# Patient Record
Sex: Female | Born: 1995 | Race: Black or African American | Hispanic: No | Marital: Single | Smoking: Never smoker
Health system: Southern US, Community
[De-identification: ages and names within clinical notes are randomized; demographics above are authoritative.]

## PROBLEM LIST (undated history)

## (undated) ENCOUNTER — Inpatient Hospital Stay (HOSPITAL_COMMUNITY): Admission: RE | Payer: Medicaid Other | Source: Ambulatory Visit | Admitting: Obstetrics and Gynecology

## (undated) DIAGNOSIS — A749 Chlamydial infection, unspecified: Secondary | ICD-10-CM

## (undated) DIAGNOSIS — Z9289 Personal history of other medical treatment: Secondary | ICD-10-CM

## (undated) DIAGNOSIS — T7840XA Allergy, unspecified, initial encounter: Secondary | ICD-10-CM

## (undated) DIAGNOSIS — A599 Trichomoniasis, unspecified: Secondary | ICD-10-CM

## (undated) HISTORY — DX: Trichomoniasis, unspecified: A59.9

## (undated) HISTORY — DX: Chlamydial infection, unspecified: A74.9

## (undated) HISTORY — DX: Personal history of other medical treatment: Z92.89

## (undated) HISTORY — PX: HERNIA REPAIR: SHX51

## (undated) HISTORY — DX: Allergy, unspecified, initial encounter: T78.40XA

---
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
## 2015-02-08 ENCOUNTER — Encounter: Payer: Medicaid Other | Admitting: Family Medicine

## 2015-04-09 ENCOUNTER — Telehealth: Payer: Self-pay | Admitting: Family Medicine

## 2015-04-09 NOTE — Telephone Encounter (Signed)
MD to be made aware.  

## 2015-04-09 NOTE — Telephone Encounter (Signed)
FYI:  Patient called and requested a refill on her birth control she told me that she has been off of the pills for a month or two. She states that she thinks she had a miscarriage last week since she had a lot of blood clots. I advised patient that she would need to come in for an appointment. I tried to schedule her an appointment for next week but she isn't sure she will be off work. She will call back on Monday to schedule.

## 2015-04-12 NOTE — Telephone Encounter (Signed)
Noted pt needs OV first,use condoms in meantime

## 2015-04-12 NOTE — Telephone Encounter (Signed)
Call placed to patient and patient made aware.   Will call to schedule at later point.

## 2015-05-11 ENCOUNTER — Emergency Department
Admission: EM | Admit: 2015-05-11 | Discharge: 2015-05-12 | Disposition: A | Discharge status: Home / Self Care | Payer: Medicaid Other | Attending: Emergency Medicine | Admitting: Emergency Medicine

## 2015-05-11 ENCOUNTER — Encounter: Payer: Self-pay | Admitting: *Deleted

## 2015-05-11 DIAGNOSIS — N760 Acute vaginitis: Secondary | ICD-10-CM | POA: Insufficient documentation

## 2015-05-11 DIAGNOSIS — B9689 Other specified bacterial agents as the cause of diseases classified elsewhere: Secondary | ICD-10-CM

## 2015-05-11 DIAGNOSIS — Z79899 Other long term (current) drug therapy: Secondary | ICD-10-CM | POA: Insufficient documentation

## 2015-05-11 DIAGNOSIS — Z3202 Encounter for pregnancy test, result negative: Secondary | ICD-10-CM | POA: Insufficient documentation

## 2015-05-11 LAB — POCT PREGNANCY, URINE: Preg Test, Ur: NEGATIVE

## 2015-05-11 LAB — URINALYSIS COMPLETE WITH MICROSCOPIC (ARMC ONLY)
BILIRUBIN URINE: NEGATIVE
GLUCOSE, UA: NEGATIVE mg/dL
Ketones, ur: NEGATIVE mg/dL
Nitrite: NEGATIVE
Protein, ur: NEGATIVE mg/dL
Specific Gravity, Urine: 1.024 (ref 1.005–1.030)
pH: 6 (ref 5.0–8.0)

## 2015-05-11 NOTE — ED Notes (Signed)
Pt has vaginal itching with vag discharge since yesterday.  No vag bleeding.  No urinary sx.

## 2015-05-12 LAB — WET PREP, GENITAL
TRICH WET PREP: NONE SEEN
YEAST WET PREP: NONE SEEN

## 2015-05-12 LAB — CHLAMYDIA/NGC RT PCR (ARMC ONLY)
Chlamydia Tr: NOT DETECTED
N gonorrhoeae: NOT DETECTED

## 2015-05-12 MED ORDER — FLUCONAZOLE 50 MG PO TABS
ORAL_TABLET | ORAL | Status: AC
  Filled 2015-05-12: qty 1

## 2015-05-12 MED ORDER — FLUCONAZOLE 100 MG PO TABS
150.0000 mg | ORAL_TABLET | Freq: Every day | ORAL | Status: DC
  Administered 2015-05-12: 150 mg via ORAL

## 2015-05-12 MED ORDER — METRONIDAZOLE 500 MG PO TABS
500.0000 mg | ORAL_TABLET | Freq: Two times a day (BID) | ORAL | Status: AC
Start: 2015-05-12 — End: 2015-05-18

## 2015-05-12 MED ORDER — FLUCONAZOLE 100 MG PO TABS
ORAL_TABLET | ORAL | Status: AC
Start: 2015-05-12 — End: 2015-05-12
  Filled 2015-05-12: qty 1

## 2015-05-12 MED ORDER — METRONIDAZOLE 500 MG PO TABS
500.0000 mg | ORAL_TABLET | Freq: Once | ORAL | Status: AC
  Administered 2015-05-12: 500 mg via ORAL
  Filled 2015-05-12: qty 1

## 2015-05-12 NOTE — ED Notes (Signed)
Assisted Dr. Manson PasseyBrown with pelvic exam. Specimens obtained, labeled appropriately and sent to lab. Pt tolerated well.

## 2015-05-12 NOTE — ED Provider Notes (Signed)
[facility] Emergency Department Provider Note  ____________________________________________  Time seen: 11:20 PM  I have reviewed the triage vital signs and the nursing notes.   HISTORY  Chief Complaint Vaginal Itching     HPI Sandra Palmer is a 19 y.o. female presents with vaginal itching and discharge times one day. Patient admits to unprotected sex approximately 5 days ago.    Past Medical History  Diagnosis Date  . Allergy     Patient Active Problem List   Diagnosis Date Noted  . Irregular intermenstrual bleeding 05/15/2014  . Physical exam 05/15/2014  . Allergic rhinitis 11/10/2013  . Bacterial respiratory infection 11/10/2013  . Contraceptive management 04/28/2013    Past Surgical History  Procedure Laterality Date  . Hernia repair      Current Outpatient Rx  Name  Route  Sig  Dispense  Refill  . clotrimazole (LOTRIMIN) 1 % cream   Topical   Apply 1 application topically 2 (two) times daily.   30 g   0   . ibuprofen (ADVIL,MOTRIN) 600 MG tablet   Oral   Take 1 tablet (600 mg total) by mouth every 6 (six) hours as needed.   30 tablet   0   . Norgestimate-Ethinyl Estradiol Triphasic 0.18/0.215/0.25 MG-25 MCG tab   Oral   Take 1 tablet by mouth daily.   1 Package   3     Allergies Azithromycin  No family history on file.  Social History Social History  Substance Use Topics  . Smoking status: Never Smoker   . Smokeless tobacco: None  . Alcohol Use: No     Comment: social    Review of Systems  Constitutional: Negative for fever. Eyes: Negative for visual changes. ENT: Negative for sore throat. Cardiovascular: Negative for chest pain. Respiratory: Negative for shortness of breath. Gastrointestinal: Negative for abdominal pain, vomiting and diarrhea. Genitourinary: Negative for dysuria. Positive vaginal discharge and itching Musculoskeletal: Negative for back pain. Skin: Negative for rash. Neurological:  Negative for headaches, focal weakness or numbness.   10-point ROS otherwise negative.  ____________________________________________   PHYSICAL EXAM:  VITAL SIGNS: ED Triage Vitals  Enc Vitals Group     BP 05/11/15 2224 139/78 mmHg     Pulse Rate 05/11/15 2224 82     Resp 05/11/15 2224 18     Temp 05/11/15 2224 98.5 F (36.9 C)     Temp Source 05/11/15 2224 Oral     SpO2 05/11/15 2224 99 %     Weight 05/11/15 2224 150 lb (68.04 kg)     Height 05/11/15 2224  (1.702 m)     Head Cir --      Peak Flow --      Pain Score 05/11/15 2226 8     Pain Loc --      Pain Edu? --      Excl. in GC? --      Constitutional: Alert and oriented. Well appearing and in no distress. Eyes: Conjunctivae are normal. PERRL. Normal extraocular movements. ENT   Head: Normocephalic and atraumatic.   Nose: No congestion/rhinnorhea.   Mouth/Throat: Mucous membranes are moist.   Neck: No stridor. Hematological/Lymphatic/Immunilogical: No cervical lymphadenopathy. Cardiovascular: Normal rate, regular rhythm. Normal and symmetric distal pulses are present in all extremities. No murmurs, rubs, or gallops. Respiratory: Normal respiratory effort without tachypnea nor retractions. Breath sounds are clear and equal bilaterally. No wheezes/rales/rhonchi. Gastrointestinal: Soft and nontender. No distention. There is no CVA tenderness. Genitourinary: Yellow to  white vaginal discharge noted on exam Musculoskeletal: Nontender with normal range of motion in all extremities. No joint effusions.  No lower extremity tenderness nor edema. Neurologic:  Normal speech and language. No gross focal neurologic deficits are appreciated. Speech is normal.  Skin:  Skin is warm, dry and intact. No rash noted. Psychiatric: Mood and affect are normal. Speech and behavior are normal. Patient exhibits appropriate insight and judgment.  ____________________________________________    LABS (pertinent  positives/negatives) Labs Reviewed  WET PREP, GENITAL - Abnormal; Notable for the following:    Clue Cells Wet Prep HPF POC MODERATE (*)    WBC, Wet Prep HPF POC TOO NUMEROUS TO COUNT (*)    All other components within normal limits  URINALYSIS COMPLETEWITH MICROSCOPIC ([facility] ONLY) - Abnormal; Notable for the following:    Color, Urine YELLOW (*)    APPearance HAZY (*)    Hgb urine dipstick 1+ (*)    Leukocytes, UA 3+ (*)    Bacteria, UA RARE (*)    Squamous Epithelial / LPF 0-5 (*)    All other components within normal limits  CHLAMYDIA/NGC RT PCR ([facility] ONLY)  POCT PREGNANCY, URINE       INITIAL IMPRESSION / ASSESSMENT AND PLAN / ED COURSE  Pertinent labs & imaging results that were available during my care of the patient were reviewed by me and considered in my medical decision making (see chart for details).  Patient received Flagyl in the emergency department will be prescribed the same for home  ____________________________________________   FINAL CLINICAL IMPRESSION(S) / ED DIAGNOSES  Final diagnoses:  BV (bacterial vaginosis)      Darci Currentandolph N Brown, MD 05/12/15 [phone]

## 2015-05-12 NOTE — Discharge Instructions (Signed)

## 2015-05-18 DIAGNOSIS — Z9289 Personal history of other medical treatment: Secondary | ICD-10-CM

## 2015-05-18 DIAGNOSIS — A749 Chlamydial infection, unspecified: Secondary | ICD-10-CM

## 2015-05-18 HISTORY — DX: Personal history of other medical treatment: Z92.89

## 2015-05-18 HISTORY — DX: Chlamydial infection, unspecified: A74.9

## 2015-09-18 ENCOUNTER — Emergency Department
Admission: EM | Admit: 2015-09-18 | Discharge: 2015-09-18 | Disposition: A | Discharge status: Home / Self Care | Payer: Medicaid Other | Attending: Emergency Medicine | Admitting: Emergency Medicine

## 2015-09-18 ENCOUNTER — Encounter: Payer: Self-pay | Admitting: Emergency Medicine

## 2015-09-18 DIAGNOSIS — Y92410 Unspecified street and highway as the place of occurrence of the external cause: Secondary | ICD-10-CM | POA: Insufficient documentation

## 2015-09-18 DIAGNOSIS — S3992XA Unspecified injury of lower back, initial encounter: Secondary | ICD-10-CM | POA: Insufficient documentation

## 2015-09-18 DIAGNOSIS — S0990XA Unspecified injury of head, initial encounter: Secondary | ICD-10-CM | POA: Insufficient documentation

## 2015-09-18 DIAGNOSIS — Y9389 Activity, other specified: Secondary | ICD-10-CM | POA: Insufficient documentation

## 2015-09-18 DIAGNOSIS — Y998 Other external cause status: Secondary | ICD-10-CM | POA: Insufficient documentation
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
## 2015-09-19 ENCOUNTER — Encounter: Payer: Self-pay | Admitting: Emergency Medicine

## 2015-09-19 ENCOUNTER — Emergency Department
Admission: EM | Admit: 2015-09-19 | Discharge: 2015-09-19 | Disposition: A | Discharge status: Home / Self Care | Payer: Self-pay | Attending: Student | Admitting: Student

## 2015-09-19 ENCOUNTER — Emergency Department: Payer: Medicaid Other

## 2015-09-19 DIAGNOSIS — S46912A Strain of unspecified muscle, fascia and tendon at shoulder and upper arm level, left arm, initial encounter: Secondary | ICD-10-CM | POA: Insufficient documentation

## 2015-09-19 DIAGNOSIS — Z79899 Other long term (current) drug therapy: Secondary | ICD-10-CM | POA: Insufficient documentation

## 2015-09-19 DIAGNOSIS — S39012A Strain of muscle, fascia and tendon of lower back, initial encounter: Secondary | ICD-10-CM | POA: Insufficient documentation

## 2015-09-19 DIAGNOSIS — Y9389 Activity, other specified: Secondary | ICD-10-CM | POA: Insufficient documentation

## 2015-09-19 DIAGNOSIS — Y998 Other external cause status: Secondary | ICD-10-CM | POA: Insufficient documentation

## 2015-09-19 DIAGNOSIS — Y92410 Unspecified street and highway as the place of occurrence of the external cause: Secondary | ICD-10-CM | POA: Insufficient documentation

## 2015-09-19 MED ORDER — CYCLOBENZAPRINE HCL 5 MG PO TABS: 5.0000 mg | ORAL_TABLET | Freq: Three times a day (TID) | ORAL | Status: DC | PRN

## 2015-09-19 MED ORDER — IBUPROFEN 800 MG PO TABS: 800.0000 mg | ORAL_TABLET | Freq: Three times a day (TID) | ORAL | Status: DC | PRN
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
## 2015-09-19 NOTE — ED Notes (Addendum)
Pt ambulatory to XR. No acute distress noted.

## 2016-12-20 ENCOUNTER — Telehealth: Payer: Self-pay

## 2016-12-20 NOTE — Telephone Encounter (Signed)
Pt calling - she has not had a period in 5972m, 2 neg home preg tests.  Does she need blood work?  Before calling pt I noticed she has an appt c J[facility] on the 28th.  Pt states she has had unprotected sex for a long time - she was on depo - last inj 06/2016.  On May 25th she had some bleeding but nothing since.  Pt wants to be seen sooner.  Adv that is a lengthy appt so that is the earliest we have.  Since she is not bleeding not there is no need for us to see her today.  Pt aware to call if bleeds again.  Pt states she needs everything done as she hasn't been here in a while.  Adv everything would be done on the 28th.

## 2016-12-26 NOTE — Telephone Encounter (Signed)
Pt is calling wanting to speak to the nurse she spoke with. Please call

## 2016-12-26 NOTE — Telephone Encounter (Signed)
LMTC

## 2016-12-28 NOTE — Telephone Encounter (Signed)
Pt hasn't returned call. 

## 2017-01-11 ENCOUNTER — Ambulatory Visit (INDEPENDENT_AMBULATORY_CARE_PROVIDER_SITE_OTHER): Payer: Medicaid Other | Admitting: Advanced Practice Midwife

## 2017-01-11 ENCOUNTER — Encounter: Payer: Self-pay | Admitting: Advanced Practice Midwife

## 2017-01-11 VITALS — BP 114/70 | Ht 67.0 in | Wt 141.0 lb

## 2017-01-11 DIAGNOSIS — Z124 Encounter for screening for malignant neoplasm of cervix: Secondary | ICD-10-CM

## 2017-01-11 DIAGNOSIS — Z113 Encounter for screening for infections with a predominantly sexual mode of transmission: Secondary | ICD-10-CM

## 2017-01-11 DIAGNOSIS — Z01419 Encounter for gynecological examination (general) (routine) without abnormal findings: Secondary | ICD-10-CM

## 2017-01-11 DIAGNOSIS — Z308 Encounter for other contraceptive management: Secondary | ICD-10-CM | POA: Diagnosis not present

## 2017-01-11 DIAGNOSIS — Z30011 Encounter for initial prescription of contraceptive pills: Secondary | ICD-10-CM

## 2017-01-11 DIAGNOSIS — Z0000 Encounter for general adult medical examination without abnormal findings: Secondary | ICD-10-CM | POA: Diagnosis not present

## 2017-01-11 MED ORDER — NORETHIN ACE-ETH ESTRAD-FE 1-20 MG-MCG PO TABS: 1.0000 | ORAL_TABLET | Freq: Every day | ORAL | 11 refills | Status: DC

## 2017-01-11 NOTE — Progress Notes (Signed)
Patient ID: Sandra Palmer, female   DOB: 1995-09-23, 21 y.o.   MRN: [id number]     Gynecology Annual Exam  PCP: System, Pcp Not In  Chief Complaint:  Chief Complaint  Patient presents with  . Annual Exam    History of Present Illness: Patient is a 21 y.o. G0P0000 presents for annual exam. The patient has complaints today of irregular periods since her last Depo shot in December. She has not been on birth control since then. She admits unprotected intercourse in the last few months. She had some light bleeding a few weeks ago that lasted for a couple of days.    LMP: No LMP recorded. Patient is not currently having periods (Reason: Irregular Periods). Menarche:not applicable Average Interval: irregular, see HPI Duration of flow: 3-4 days Heavy Menses: no Clots: no Intermenstrual Bleeding: no Postcoital Bleeding: no Dysmenorrhea: no  The patient is sexually active. She currently uses none for contraception. She denies dyspareunia.  The patient does occasionally perform self breast exams.  There is no notable family history of breast or ovarian cancer in her family.  The patient wears seatbelts: yes.  The patient has regular exercise: no.  She is active at work but denies cardiac exercise. She admits to some healthy foods but usually eats fast food. She admits good support from friends and family.  The patient denies current symptoms of depression.    Review of Systems: Review of Systems  Constitutional: Negative.   HENT: Negative.   Eyes: Negative.   Respiratory: Negative.   Cardiovascular: Negative.   Gastrointestinal: Negative.   Genitourinary: Negative.   Musculoskeletal: Negative.   Skin: Negative.   Neurological: Negative.   Endo/Heme/Allergies: Negative.   Psychiatric/Behavioral: Negative.     Past Medical History:  Past Medical History:  Diagnosis Date  . Allergy   . Chlamydia 05/18/2015  . History of Papanicolaou smear of cervix 05/18/2015   Nil/Pos     Past Surgical History:  Past Surgical History:  Procedure Laterality Date  . HERNIA REPAIR      Gynecologic History:  No LMP recorded. Patient is not currently having periods (Reason: Irregular Periods). Contraception: none Last Pap: Results were: has not yet had a PAP   Obstetric History: G0P0000  Family History:  No family history on file.  Social History:  Social History   Social History  . Marital status: Single    Spouse name: N/A  . Number of children: N/A  . Years of education: N/A   Occupational History  . Not on file.   Social History Main Topics  . Smoking status: Never Smoker  . Smokeless tobacco: Not on file  . Alcohol use No     Comment: social  . Drug use: No  . Sexual activity: Yes    Birth control/ protection: Condom   Other Topics Concern  . Not on file   Social History Narrative   Lives with mother    Allergies:  Allergies  Allergen Reactions  . Azithromycin Swelling and Rash    Medications: Prior to Admission medications   Medication Sig Start Date End Date Taking? Authorizing Provider  cyclobenzaprine (FLEXERIL) 5 MG tablet Take 1 tablet (5 mg total) by mouth every 8 (eight) hours as needed for muscle spasms. Patient not taking: Reported on 01/11/2017 09/19/15   Evangeline Dakin, PA-C  ibuprofen (ADVIL,MOTRIN) 800 MG tablet Take 1 tablet (800 mg total) by mouth every 8 (eight) hours as needed. Patient not taking: Reported on 01/11/2017  09/19/15   Beers, Charmayne Sheerharles M, PA-C  Norgestimate-Ethinyl Estradiol Triphasic 0.18/0.215/0.25 MG-25 MCG tab Take 1 tablet by mouth daily. Patient not taking: Reported on 01/11/2017 09/23/14   Salley Scarleturham, Kawanta F, MD    Physical Exam Vitals: Blood pressure 114/70, height 5\' 7"  (1.702 m), weight 141 lb (64 kg).  General: NAD HEENT: normocephalic, anicteric Thyroid: no enlargement, no palpable nodules Pulmonary: No increased work of breathing, CTAB Cardiovascular: RRR, distal pulses 2+ Breast: Breast  symmetrical, no tenderness, no palpable nodules or masses, no skin or nipple retraction present, no nipple discharge.  No axillary or supraclavicular lymphadenopathy. Abdomen: NABS, soft, non-tender, non-distended.  Umbilicus without lesions.  No hepatomegaly, splenomegaly or masses palpable. No evidence of hernia  Genitourinary:  External: Normal external female genitalia.  Normal urethral meatus, normal  Bartholin's and Skene's glands.    Vagina: Normal vaginal mucosa, no evidence of prolapse.    Cervix: Grossly normal in appearance, no bleeding, no CMT  Uterus: Non-enlarged, mobile, normal contour.    Adnexa: ovaries non-enlarged, no adnexal masses  Rectal: deferred  Lymphatic: no evidence of inguinal lymphadenopathy Extremities: no edema, erythema, or tenderness Neurologic: Grossly intact Psychiatric: mood appropriate, affect full   Assessment: 21 y.o. G0P0000 Well woman exam with PAPtima  Plan: Problem List Items Addressed This Visit    None    Visit Diagnoses    Well woman exam with routine gynecological exam    -  Primary      1) 4) Gardasil Series discussed and if applicable offered to patient - Patient has not previously completed 3 shot series   2) STI screening was offered and accepted   3) ASCCP guidelines and rational discussed.  Patient opts for yearly screening interval  4) Contraception - Education given regarding options for contraception, including pills, depo, ring, patch, LARCs. Patient chooses pills at this time.  5) Increase healthy lifestyle diet and exercise   6) Follow up 1 year for routine annual exam   Tresea MallJane Raylon Lamson, CNM

## 2017-01-14 LAB — IGP,CTNGTV,RFX APTIMA HPV ASCU
Chlamydia, Nuc. Acid Amp: NEGATIVE
Gonococcus, Nuc. Acid Amp: NEGATIVE
PAP SMEAR COMMENT: 0
Trich vag by NAA: NEGATIVE

## 2017-05-08 ENCOUNTER — Ambulatory Visit (INDEPENDENT_AMBULATORY_CARE_PROVIDER_SITE_OTHER): Payer: Medicaid Other | Admitting: Obstetrics & Gynecology

## 2017-05-08 ENCOUNTER — Other Ambulatory Visit (HOSPITAL_COMMUNITY)
Admission: RE | Admit: 2017-05-08 | Discharge: 2017-05-08 | Disposition: A | Discharge status: Home / Self Care | Payer: Medicaid Other | Source: Ambulatory Visit | Attending: Obstetrics & Gynecology | Admitting: Obstetrics & Gynecology

## 2017-05-08 ENCOUNTER — Encounter: Payer: Self-pay | Admitting: Obstetrics & Gynecology

## 2017-05-08 VITALS — BP 125/84 | HR 75 | Ht 67.0 in | Wt 147.4 lb

## 2017-05-08 DIAGNOSIS — Z309 Encounter for contraceptive management, unspecified: Secondary | ICD-10-CM | POA: Diagnosis not present

## 2017-05-08 DIAGNOSIS — N760 Acute vaginitis: Secondary | ICD-10-CM

## 2017-05-08 DIAGNOSIS — N921 Excessive and frequent menstruation with irregular cycle: Secondary | ICD-10-CM | POA: Insufficient documentation

## 2017-05-08 DIAGNOSIS — B9689 Other specified bacterial agents as the cause of diseases classified elsewhere: Secondary | ICD-10-CM

## 2017-05-08 DIAGNOSIS — Z30011 Encounter for initial prescription of contraceptive pills: Secondary | ICD-10-CM

## 2017-05-08 MED ORDER — TAYTULLA 1-20 MG-MCG(24) PO CAPS: 1.0000 | ORAL_CAPSULE | Freq: Every day | ORAL | 3 refills | Status: DC

## 2017-05-08 NOTE — Progress Notes (Signed)
GYNECOLOGY OFFICE VISIT NOTE  History:  21 y.o. G0 here today for discussion of AUB management. Has been on OCPs and Depo in the past, not been taking it recently. Still has AUB, for past several years, does not remember what workup was done. She denies any current abnormal vaginal discharge, pelvic pain or other concerns.   Past Medical History:  Diagnosis Date  . Allergy   . Chlamydia 05/18/2015  . History of Papanicolaou smear of cervix 05/18/2015   Nil/Pos    Past Surgical History:  Procedure Laterality Date  . HERNIA REPAIR      The following portions of the patient's history were reviewed and updated as appropriate: allergies, current medications, past family history, past medical history, past social history, past surgical history and problem list.   Health Maintenance:  Normal pap on 01/11/2017.   Review of Systems:  Pertinent items noted in HPI and remainder of comprehensive ROS otherwise negative.   Objective:  Physical Exam BP 125/84   Pulse 75   Ht 5\' 7"  (1.702 m)   Wt 147 lb 6.4 oz (66.9 kg)   LMP 04/29/2017   BMI 23.09 kg/m  CONSTITUTIONAL: Well-developed, well-nourished female in no acute distress.  HENT:  Normocephalic, atraumatic. External right and left ear normal. Oropharynx is clear and moist EYES: Conjunctivae and EOM are normal. Pupils are equal, round, and reactive to light. No scleral icterus.  NECK: Normal range of motion, supple, no masses SKIN: Skin is warm and dry. No rash noted. Not diaphoretic. No erythema. No pallor. NEUROLOGIC: Alert and oriented to person, place, and time. Normal reflexes, muscle tone coordination. No cranial nerve deficit noted. PSYCHIATRIC: Normal mood and affect. Normal behavior. Normal judgment and thought content. CARDIOVASCULAR: Normal heart rate noted RESPIRATORY: Effort and breath sounds normal, no problems with respiration noted ABDOMEN: Soft, no distention noted.   PELVIC: Normal appearing external genitalia;  normal appearing vaginal mucosa and cervix.  Scant bloody discharge noted.  Normal uterine size, no other palpable masses, no uterine or adnexal tenderness. MUSCULOSKELETAL: Normal range of motion. No edema noted.  Labs and Imaging No results found.  Assessment & Plan:  1. Irregular intermenstrual bleeding Patient has abnormal uterine bleeding, likely anovulatory bleeding. She has a normal exam, no evidence of lesions.  Will order abnormal uterine bleeding evaluation labs and pelvic ultrasound to evaluate for any structural gynecologic abnormalities.  Will contact patient with these results and plans for further evaluation/management.  Discussed appropriate management options for abnormal uterine bleeding including tranexamic acid (Lysteda), oral progesterone, OCPs,  Depo Provera, Mirena IUD.  Discussed risks and benefits of each method.   Patient desires to retry OCPs for now, Taytulla samples given. Bleeding precautions reviewed, will reevaluate in 2 months.  - Prolactin - Testosterone,Free and Total - TSH - Von Willebrand panel - CBC - Beta HCG, Quant - Cervicovaginal ancillary - US PELVIC COMPLETE WITH TRANSVAGINAL; Future - TAYTULLA 1-20 MG-MCG(24) CAPS; Take 1 tablet by mouth daily.  Dispense: 56 capsule; Refill: 3  2. Encounter for initial prescription of contraceptive pills Recommended condoms for STI prevention also. - TAYTULLA 1-20 MG-MCG(24) CAPS; Take 1 tablet by mouth daily.  Dispense: 56 capsule; Refill: 3  Routine preventative health maintenance measures emphasized. Please refer to After Visit Summary for other counseling recommendations.   Return in about 2 months (around 07/08/2017) for OCP/BP check, follow up AUB.   Total face-to-face time with patient: 30 minutes. Over 50% of encounter was spent on counseling and coordination of  care.   Jaynie CollinsUGONNA  Lajuan Godbee, MD, FACOG Attending Obstetrician & Gynecologist, [facility], [facility]

## 2017-05-08 NOTE — Patient Instructions (Signed)
Dysfunctional Uterine Bleeding °Dysfunctional uterine bleeding is abnormal bleeding from the uterus. Dysfunctional uterine bleeding includes: °· A period that comes earlier or later than usual. °· A period that is lighter, heavier, or has blood clots. °· Bleeding between periods. °· Skipping one or more periods. °· Bleeding after sexual intercourse. °· Bleeding after menopause. ° °Follow these instructions at home: °Pay attention to any changes in your symptoms. Follow these instructions to help with your condition: °Eating and drinking °· Eat well-balanced meals. Include foods that are high in iron, such as liver, meat, shellfish, green leafy vegetables, and eggs. °· If you become constipated: °? Drink plenty of water. °? Eat fruits and vegetables that are high in water and fiber, such as spinach, carrots, raspberries, apples, and mango. °Medicines °· Take over-the-counter and prescription medicines only as told by your health care provider. °· Do not change medicines without talking with your health care provider. °· Aspirin or medicines that contain aspirin may make the bleeding worse. Do not take those medicines: °? During the week before your period. °? During your period. °· If you were prescribed iron pills, take them as told by your health care provider. Iron pills help to replace iron that your body loses because of this condition. °Activity °· If you need to change your sanitary pad or tampon more than one time every 2 hours: °? Lie in bed with your feet raised (elevated). °? Place a cold pack on your lower abdomen. °? Rest as much as possible until the bleeding stops or slows down. °· Do not try to lose weight until the bleeding has stopped and your blood iron level is back to normal. °Other Instructions °· For two months, write down: °? When your period starts. °? When your period ends. °? When any abnormal bleeding occurs. °? What problems you notice. °· Keep all follow up visits as told by your health  care provider. This is important. °Contact a health care provider if: °· You get light-headed or weak. °· You have nausea and vomiting. °· You cannot eat or drink without vomiting. °· You feel dizzy or have diarrhea while you are taking medicines. °· You are taking birth control pills or hormones, and you want to change them or stop taking them. °Get help right away if: °· You develop a fever or chills. °· You need to change your sanitary pad or tampon more than one time per hour. °· Your bleeding becomes heavier, or your flow contains clots more often. °· You develop pain in your abdomen. °· You lose consciousness. °· You develop a rash. °This information is not intended to replace advice given to you by your health care provider. Make sure you discuss any questions you have with your health care provider. °Document Released: 06/30/2000 Document Revised: 12/09/2015 Document Reviewed: 09/28/2014 °Elsevier Interactive Patient Education © 2018 [employer] ° °

## 2017-05-09 LAB — CERVICOVAGINAL ANCILLARY ONLY
Bacterial vaginitis: POSITIVE — AB
CANDIDA VAGINITIS: NEGATIVE
Chlamydia: NEGATIVE
Neisseria Gonorrhea: NEGATIVE
Trichomonas: NEGATIVE

## 2017-05-10 MED ORDER — METRONIDAZOLE 500 MG PO TABS: 500.0000 mg | ORAL_TABLET | Freq: Two times a day (BID) | ORAL | 0 refills | Status: DC

## 2017-05-10 NOTE — Addendum Note (Signed)
Addended by: Jaynie CollinsANYANWU, UGONNA A on: 05/10/2017 10:12 AM   Modules accepted: Orders

## 2017-05-11 LAB — CBC
HEMATOCRIT: 42 % (ref 34.0–46.6)
Hemoglobin: 14.1 g/dL (ref 11.1–15.9)
MCH: 28.7 pg (ref 26.6–33.0)
MCHC: 33.6 g/dL (ref 31.5–35.7)
MCV: 86 fL (ref 79–97)
PLATELETS: 227 10*3/uL (ref 150–379)
RBC: 4.91 x10E6/uL (ref 3.77–5.28)
RDW: 14.7 % (ref 12.3–15.4)
WBC: 7 10*3/uL (ref 3.4–10.8)

## 2017-05-11 LAB — PROLACTIN: Prolactin: 13.2 ng/mL (ref 4.8–23.3)

## 2017-05-11 LAB — VON WILLEBRAND PANEL
Factor VIII Activity: 103 % (ref 57–163)
VON WILLEBRAND FACTOR: 99 % (ref 50–200)
Von Willebrand Ag: 134 % (ref 50–200)

## 2017-05-11 LAB — BETA HCG QUANT (REF LAB): hCG Quant: <1 m[IU]/mL

## 2017-05-11 LAB — TESTOSTERONE,FREE AND TOTAL
Testosterone, Free: 1.4 pg/mL (ref 0.0–4.2)
Testosterone: 22 ng/dL (ref 8–48)

## 2017-05-11 LAB — COAG STUDIES INTERP REPORT

## 2017-05-11 LAB — TSH: TSH: 1.11 u[IU]/mL (ref 0.450–4.500)

## 2017-07-05 ENCOUNTER — Ambulatory Visit: Payer: Medicaid Other | Admitting: Obstetrics & Gynecology

## 2017-09-05 ENCOUNTER — Other Ambulatory Visit: Payer: Self-pay

## 2017-09-05 ENCOUNTER — Emergency Department (HOSPITAL_COMMUNITY)
Admission: EM | Admit: 2017-09-05 | Discharge: 2017-09-05 | Disposition: A | Discharge status: Home / Self Care | Payer: Medicaid Other | Attending: Emergency Medicine | Admitting: Emergency Medicine

## 2017-09-05 ENCOUNTER — Encounter (HOSPITAL_COMMUNITY): Payer: Self-pay | Admitting: Emergency Medicine

## 2017-09-05 DIAGNOSIS — Z5321 Procedure and treatment not carried out due to patient leaving prior to being seen by health care provider: Secondary | ICD-10-CM | POA: Insufficient documentation

## 2017-09-05 DIAGNOSIS — R1030 Lower abdominal pain, unspecified: Secondary | ICD-10-CM | POA: Insufficient documentation

## 2017-09-05 LAB — URINALYSIS, ROUTINE W REFLEX MICROSCOPIC
BACTERIA UA: NONE SEEN
BILIRUBIN URINE: NEGATIVE
Glucose, UA: NEGATIVE mg/dL
Hgb urine dipstick: NEGATIVE
Ketones, ur: NEGATIVE mg/dL
Nitrite: NEGATIVE
PROTEIN: NEGATIVE mg/dL
SPECIFIC GRAVITY, URINE: 1.021 (ref 1.005–1.030)
pH: 7 (ref 5.0–8.0)

## 2017-09-05 LAB — POC URINE PREG, ED
PREG TEST UR: NEGATIVE
PREG TEST UR: NEGATIVE

## 2017-09-05 NOTE — ED Triage Notes (Signed)
Pt reports R sided groin pain intermittently x2 weeks, denies pain at this time. States today the pain was worse, prompting her visit - described as a shocking/punching pain.  LMP 07/17/17, just spotting noted.

## 2017-09-05 NOTE — ED Notes (Signed)
Pt states she no longer wants to wait to be seen.

## 2017-09-06 NOTE — ED Notes (Signed)
09/06/2017,  Follow-up call completed. 

## 2017-09-07 ENCOUNTER — Ambulatory Visit: Payer: Medicaid Other | Admitting: Obstetrics and Gynecology

## 2017-11-08 ENCOUNTER — Emergency Department (HOSPITAL_COMMUNITY)
Admission: EM | Admit: 2017-11-08 | Discharge: 2017-11-08 | Disposition: A | Discharge status: Home / Self Care | Payer: Self-pay | Attending: Emergency Medicine | Admitting: Emergency Medicine

## 2017-11-08 ENCOUNTER — Encounter (HOSPITAL_COMMUNITY): Payer: Self-pay | Admitting: Emergency Medicine

## 2017-11-08 ENCOUNTER — Emergency Department (HOSPITAL_COMMUNITY): Payer: Self-pay

## 2017-11-08 DIAGNOSIS — Y939 Activity, unspecified: Secondary | ICD-10-CM | POA: Insufficient documentation

## 2017-11-08 DIAGNOSIS — Y999 Unspecified external cause status: Secondary | ICD-10-CM | POA: Insufficient documentation

## 2017-11-08 DIAGNOSIS — S61211A Laceration without foreign body of left index finger without damage to nail, initial encounter: Secondary | ICD-10-CM | POA: Insufficient documentation

## 2017-11-08 DIAGNOSIS — Y929 Unspecified place or not applicable: Secondary | ICD-10-CM | POA: Insufficient documentation

## 2017-11-08 DIAGNOSIS — W25XXXA Contact with sharp glass, initial encounter: Secondary | ICD-10-CM | POA: Insufficient documentation

## 2017-11-08 DIAGNOSIS — Z79899 Other long term (current) drug therapy: Secondary | ICD-10-CM | POA: Insufficient documentation

## 2017-11-08 MED ORDER — BACITRACIN ZINC 500 UNIT/GM EX OINT
TOPICAL_OINTMENT | Freq: Two times a day (BID) | CUTANEOUS | Status: DC
  Administered 2017-11-08: 05:00:00 via TOPICAL

## 2017-11-08 MED ORDER — BACITRACIN ZINC 500 UNIT/GM EX OINT: 1.0000 "application " | TOPICAL_OINTMENT | Freq: Two times a day (BID) | CUTANEOUS | 0 refills | Status: DC

## 2017-11-08 MED ORDER — LIDOCAINE-EPINEPHRINE (PF) 2 %-1:200000 IJ SOLN
10.0000 mL | Freq: Once | INTRAMUSCULAR | Status: AC
  Administered 2017-11-08: 10 mL
  Filled 2017-11-08: qty 20

## 2017-11-08 NOTE — ED Triage Notes (Signed)
Pt reports cutting L hand on glass this evening. Small lac noted, small abrasion noted. Tetanus not UTD

## 2017-11-08 NOTE — ED Provider Notes (Signed)
[facility] EMERGENCY DEPARTMENT Provider Note   CSN: [id number] Arrival date & time: 11/08/17  0037     History   Chief Complaint Chief Complaint  Patient presents with  . Laceration    HPI Sandra Palmer is a 22 y.o. female.  HPI  SUBJECTIVE:  22 y.o. female sustained laceration of finger # 1, left side 3 hours ago. Nature of injury: - mirror shattered causing a cut. Tetanus vaccination status reviewed: last tetanus booster 5 years ago. Pt denies any numbness, tingling. Pt able to move the finger fine. Pt is R handed.    Past Medical History:  Diagnosis Date  . Allergy   . Chlamydia 05/18/2015  . History of Papanicolaou smear of cervix 05/18/2015   Nil/Pos    Patient Active Problem List   Diagnosis Date Noted  . Irregular intermenstrual bleeding 05/15/2014  . Physical exam 05/15/2014  . Allergic rhinitis 11/10/2013  . Bacterial respiratory infection 11/10/2013  . Contraceptive management 04/28/2013    Past Surgical History:  Procedure Laterality Date  . HERNIA REPAIR       OB History    Gravida  0   Para  0   Term  0   Preterm  0   AB  0   Living  0     SAB  0   TAB  0   Ectopic  0   Multiple  0   Live Births               Home Medications    Prior to Admission medications   Medication Sig Start Date End Date Taking? Authorizing Provider  bacitracin ointment Apply 1 application topically 2 (two) times daily. 11/08/17   Derwood Kaplan, MD  metroNIDAZOLE (FLAGYL) 500 MG tablet Take 1 tablet (500 mg total) by mouth 2 (two) times daily. 05/10/17   Anyanwu, Jethro Bastos, MD  TAYTULLA 1-20 MG-MCG(24) CAPS Take 1 tablet by mouth daily. 05/08/17   Tereso Newcomer, MD    Family History No family history on file.  Social History Social History   Tobacco Use  . Smoking status: Never Smoker  . Smokeless tobacco: Never Used  Substance Use Topics  . Alcohol use: No    Comment: social  . Drug use: No      Allergies   Azithromycin   Review of Systems Review of Systems  Constitutional: Positive for activity change.  Skin: Positive for wound.  Allergic/Immunologic: Negative for immunocompromised state.  Neurological: Negative for numbness.  Hematological: Does not bruise/bleed easily.     Physical Exam Updated Vital Signs BP 129/63 (BP Location: Right Arm)   Pulse 88   Temp 98.9 F (37.2 C) (Oral)   Resp 16   SpO2 99%   Physical Exam  Constitutional: She is oriented to person, place, and time. She appears well-developed and well-nourished.  HENT:  Head: Normocephalic and atraumatic.  Eyes: Pupils are equal, round, and reactive to light. EOM are normal.  Neck: Normal range of motion. Neck supple.  Cardiovascular: Normal rate, regular rhythm and normal heart sounds.  No murmur heard. Pulmonary/Chest: Effort normal. No respiratory distress.  Abdominal: Soft. Bowel sounds are normal. She exhibits no distension. There is no tenderness. There is no rebound and no guarding.  Neurological: She is alert and oriented to person, place, and time.  ROM over the IP joints fine for the entire L hand. Ross sensory exam is normal.  Skin: Skin is warm and dry.  Nursing note and vitals reviewed.    ED Treatments / Results  Labs (all labs ordered are listed, but only abnormal results are displayed) Labs Reviewed - No data to display  EKG None  Radiology Dg Hand 2 View Right  Result Date: 11/08/2017 CLINICAL DATA:  Small mark/laceration along the right palm. EXAM: RIGHT HAND - 2 VIEW COMPARISON:  None. FINDINGS: There is no evidence of fracture or dislocation. No radiopaque foreign body. No soft tissue emphysema. There is no evidence of arthropathy or other focal bone abnormality. Soft tissues are unremarkable. IMPRESSION: Negative for acute osseous abnormality. No radiopaque body. Reported laceration or mark along the palm is radiographically occult. Electronically Signed   By:  Tollie Ethavid  Kwon M.D.   On: 11/08/2017 02:46   Dg Hand 2 View Left  Result Date: 11/08/2017 CLINICAL DATA:  Patient cut left hand at the thumb and index finger. EXAM: LEFT HAND - 2 VIEW COMPARISON:  None. FINDINGS: There is no evidence of fracture or dislocation. Soft tissue laceration along the radial aspect of the left index finge finger in first webspace between the thumb and index finger. No radiopaque foreign body nor osseous involvement. No subcutaneous emphysema. There is no evidence of arthropathy or other focal bone abnormality. IMPRESSION: Lacerations of the first webspace and index finger without osseous involvement. No radiopaque foreign body. Electronically Signed   By: Tollie Ethavid  Kwon M.D.   On: 11/08/2017 02:44    Procedures .Nerve Block Date/Time: 11/08/2017 5:22 AM Performed by: Derwood KaplanNanavati, Quentina Fronek, MD Authorized by: Derwood KaplanNanavati, Maddelynn Moosman, MD   Consent:    Consent obtained:  Verbal Indications:    Indications:  Procedural anesthesia Location:    Body area:  Upper extremity   Upper extremity nerve:  Metacarpal   Laterality:  Left Pre-procedure details:    Skin preparation:  Alcohol   Preparation: Patient was prepped and draped in usual sterile fashion   Skin anesthesia (see MAR for exact dosages):    Skin anesthesia method:  None Procedure details (see MAR for exact dosages):    Block needle gauge:  25 G   Anesthetic injected:  Lidocaine 1% WITH epi   Steroid injected:  None   Additive injected:  None   Injection procedure:  Anatomic landmarks identified Post-procedure details:    Dressing:  None   Outcome:  Pain improved   Patient tolerance of procedure:  Tolerated well, no immediate complications   (including critical care time)    LACERATION REPAIR Performed by: Xylon Croom Authorized by: Breeze Berringer Consent: Verbal consent obtained. Risks and benefits: risks, benefits and alternatives were discussed Consent given by: patient Patient identity confirmed: provided  demographic data Prepped and Draped in normal sterile fashion Wound explored  Laceration Location: left index finger  Laceration Length: 2cm  No Foreign Bodies seen or palpated  Irrigation method: syringe Amount of cleaning: standard  Skin closure: primary  Number of sutures: 2  Technique: simple inturrupted  Patient tolerance: Patient tolerated the procedure well with no immediate complications.   Medications Ordered in ED Medications  lidocaine-EPINEPHrine (XYLOCAINE W/EPI) 2 %-1:200000 (PF) injection 10 mL (has no administration in time range)  bacitracin ointment (has no administration in time range)     Initial Impression / Assessment and Plan / ED Course  I have reviewed the triage vital signs and the nursing notes.  Pertinent labs & imaging results that were available during my care of the patient were reviewed by me and considered in my medical decision making (see chart  for details).     ASSESSMENT:  Laceration as described.  PLAN:  Anesthesia with 1% Lidocaine with Epinephrine. Wound cleansed, debrided of visible foreign material and necrotic tissue, and sutured. Antibiotic ointment and dressing applied.  Wound care instructions provided.  Observe for any signs of infection or other problems.  Return for suture removal in 7 days.  Final Clinical Impressions(s) / ED Diagnoses   Final diagnoses:  Laceration of left index finger without foreign body without damage to nail, initial encounter    ED Discharge Orders        Ordered    bacitracin ointment  2 times daily     11/08/17 0517       Derwood Kaplan, MD 11/08/17 [phone]

## 2017-11-08 NOTE — Discharge Instructions (Signed)
We saw you in the ER for your WOUND. °Please read the instructions provided on wound care. °Keep the area clean and dry, apply bacitracin ointment daily and take the medications provided. °RETURN TO THE ER IF THERE IS INCREASED PAIN, REDNESS, PUS COMING OUT from the wound site.  °

## 2018-02-01 ENCOUNTER — Encounter: Payer: Self-pay | Admitting: Obstetrics and Gynecology

## 2018-02-01 ENCOUNTER — Other Ambulatory Visit (HOSPITAL_COMMUNITY)
Admission: RE | Admit: 2018-02-01 | Discharge: 2018-02-01 | Disposition: A | Discharge status: Home / Self Care | Payer: Medicaid Other | Source: Ambulatory Visit | Attending: Obstetrics and Gynecology | Admitting: Obstetrics and Gynecology

## 2018-02-01 ENCOUNTER — Ambulatory Visit (INDEPENDENT_AMBULATORY_CARE_PROVIDER_SITE_OTHER): Payer: Medicaid Other | Admitting: Obstetrics and Gynecology

## 2018-02-01 VITALS — BP 127/85 | HR 80 | Resp 16 | Ht 67.0 in | Wt 139.4 lb

## 2018-02-01 DIAGNOSIS — Z309 Encounter for contraceptive management, unspecified: Secondary | ICD-10-CM | POA: Diagnosis not present

## 2018-02-01 DIAGNOSIS — Z01419 Encounter for gynecological examination (general) (routine) without abnormal findings: Secondary | ICD-10-CM

## 2018-02-01 NOTE — Progress Notes (Signed)
Obstetrics and Gynecology Annual Patient Evaluation  Appointment Date: 02/01/2018  OB[facility]  Primary Care Provider: System, Pcp Not In  Referring Provider: No ref. provider found  Chief Complaint:  Chief Complaint  Patient presents with  . Annual Exam    W/PAP    History of Present Illness: Sandra Palmer is a 22 y.o. African-American G0P0000 (Patient's last menstrual period was 01/14/2018 (exact date).), seen for the above chief complaint.   Wondering about if there are any problems since she hasn't gotten pregnant  Occasional white nipple discharge (more so from the right). No pain, masses.    Review of Systems: as noted in the History of Present Illness.   Past Medical History:  Past Medical History:  Diagnosis Date  . Allergy   . Chlamydia 05/18/2015  . History of Papanicolaou smear of cervix 05/18/2015   Nil/Pos    Past Surgical History:  Past Surgical History:  Procedure Laterality Date  . HERNIA REPAIR      Past Obstetrical History:  OB History  Gravida Para Term Preterm AB Living  0 0 0 0 0 0  SAB TAB Ectopic Multiple Live Births  0 0 0 0     Past Gynecological History: As per HPI. Periods: qmonth, regular, approx 5-7d. Somewhat heavy and painful in the first few days History of Pap Smear(s): Yes.   Last pap 2018, which was NILM  Social History:  Social History   Socioeconomic History  . Marital status: Single    Spouse name: Not on file  . Number of children: Not on file  . Years of education: Not on file  . Highest education level: Not on file  Occupational History  . Not on file  Social Needs  . Financial resource strain: Not on file  . Food insecurity:    Worry: Not on file    Inability: Not on file  . Transportation needs:    Medical: Not on file    Non-medical: Not on file  Tobacco Use  . Smoking status: Never Smoker  . Smokeless tobacco: Never Used  Substance and Sexual Activity  . Alcohol  use: No    Comment: social  . Drug use: No  . Sexual activity: Yes    Partners: Male    Birth control/protection: Condom  Lifestyle  . Physical activity:    Days per week: Not on file    Minutes per session: Not on file  . Stress: Not on file  Relationships  . Social connections:    Talks on phone: Not on file    Gets together: Not on file    Attends religious service: Not on file    Active member of club or organization: Not on file    Attends meetings of clubs or organizations: Not on file    Relationship status: Not on file  . Intimate partner violence:    Fear of current or ex partner: Not on file    Emotionally abused: Not on file    Physically abused: Not on file    Forced sexual activity: Not on file  Other Topics Concern  . Not on file  Social History Narrative   Lives with mother    Family History: No family history on file.  Medications Arleta S. Willa RoughHicks had no medications administered during this visit. No current outpatient medications on file.   No current facility-administered medications for this visit.     Allergies Azithromycin   Physical Exam:  BP 127/85 (BP Location: Left Arm, Patient Position: Sitting, Cuff Size: Large)   Pulse 80   Resp 16   Ht 5\' 7"  (1.702 m)   Wt 139 lb 6.4 oz (63.2 kg)   LMP 01/14/2018 (Exact Date)   BMI 21.83 kg/m  Body mass index is 21.83 kg/m. General appearance: Well nourished, well developed female in no acute distress.  Neck:  Supple, normal appearance, and no thyromegaly  Cardiovascular: normal s1 and s2.  No murmurs, rubs or gallops. Respiratory:  Clear to auscultation bilateral. Normal respiratory effort Abdomen: positive bowel sounds and no masses, hernias; diffusely non tender to palpation, non distended Breasts: breasts appear normal, no suspicious masses, no skin or nipple changes or axillary nodes, normal palpation. Neuro/Psych:  Normal mood and affect.  Skin:  Warm and dry.  Lymphatic:  No inguinal  lymphadenopathy.   Pelvic exam: is not limited by body habitus EGBUS: within normal limits, Vagina: within normal limits and with no blood or discharge in the vault, Cervix: normal appearing cervix without tenderness, discharge or lesions. Uterus:  nonenlarged and non tender and Adnexa:  normal adnexa and no mass, fullness, tenderness Rectovaginal: deferred  Laboratory: none  Radiology: none  Assessment: pt doing well  Plan:  1. Encounter for annual routine gynecological examination Recommend timed intercourse x 5m and if success to let us know and can do work up. Recommend she start FA or a MVI in case she was to get pregnant. Medicaid pap done today. Sounds like she might be stimulating the d/c and I told her to avoid this and if still continues after 6m to let us know or if it ever changes to a different color to let us know. I was unable to express anything today.  - Cytology - PAP  RTC PRN  Cornelia Copa MD Attending Center for [facility])

## 2018-02-04 LAB — CYTOLOGY - PAP
Chlamydia: NEGATIVE
Diagnosis: NEGATIVE
NEISSERIA GONORRHEA: NEGATIVE

## 2018-06-26 ENCOUNTER — Encounter: Payer: Self-pay | Admitting: Radiology

## 2018-06-26 ENCOUNTER — Encounter: Payer: Self-pay | Admitting: Advanced Practice Midwife

## 2018-06-26 ENCOUNTER — Other Ambulatory Visit: Payer: Self-pay | Admitting: Advanced Practice Midwife

## 2018-06-26 ENCOUNTER — Ambulatory Visit (INDEPENDENT_AMBULATORY_CARE_PROVIDER_SITE_OTHER): Payer: No Typology Code available for payment source | Admitting: Advanced Practice Midwife

## 2018-06-26 VITALS — BP 129/78 | HR 85 | Ht 67.0 in | Wt 140.0 lb

## 2018-06-26 DIAGNOSIS — Z3189 Encounter for other procreative management: Secondary | ICD-10-CM

## 2018-06-26 DIAGNOSIS — N6452 Nipple discharge: Secondary | ICD-10-CM | POA: Diagnosis not present

## 2018-06-26 NOTE — Progress Notes (Signed)
GYNECOLOGY PROBLEM ENCOUNTER NOTE  Subjective:   Sandra Palmer is a 22 y.o. G0P0000 female here for a   Current complaints: Patient desires pregnancy and is concerned that she has not been able to get pregnant in the past few months. She also complains of recurrent nipple discharge, first reported in July 2019. Denies abnormal vaginal bleeding, discharge, pelvic pain, problems with intercourse or other gynecologic concerns.   Patient verbalizes that she is remotely concerned about nipple discharge, which is essentially unchanged since July. She is very concerned about lack of pregnancy. She is using an app to track her fertility but states her boyfriend is reluctant to have sex during that time, even though he states he also desires pregnancy.  Patient is taking prenatal vitamins but struggles with consistency. She endorses drinking "lots" of water and clean eating but does not exercise.  Denies alcohol, smoking, street drugs. Denies concerns for IPV.   Gynecologic History Patient's last menstrual period was 06/23/2018 (exact date). Contraception: none Last Pap: 02/01/2018. Results were: normal with negative HPV Last mammogram: N/A age 51.   Obstetric History OB History  Gravida Para Term Preterm AB Living  0 0 0 0 0 0  SAB TAB Ectopic Multiple Live Births  0 0 0 0      Past Medical History:  Diagnosis Date  . Allergy   . Chlamydia 05/18/2015  . History of Papanicolaou smear of cervix 05/18/2015   Nil/Pos    Past Surgical History:  Procedure Laterality Date  . HERNIA REPAIR      Current Outpatient Medications on File Prior to Visit  Medication Sig Dispense Refill  . Multiple Vitamins-Calcium (ONE-A-DAY WOMENS PO) Take by mouth.     No current facility-administered medications on file prior to visit.     Allergies  Allergen Reactions  . Azithromycin Swelling and Rash    Social History:  reports that she has never smoked. She has never used smokeless tobacco. She  reports that she drank alcohol. She reports that she does not use drugs.  History reviewed. No pertinent family history.  The following portions of the patient's history were reviewed and updated as appropriate: allergies, current medications, past family history, past medical history, past social history, past surgical history and problem list.  Review of Systems Pertinent items noted in HPI and remainder of comprehensive ROS otherwise negative.   Objective:  BP 129/78   Pulse 85   Ht 5\' 7"  (1.702 m)   Wt 140 lb (63.5 kg)   LMP 06/23/2018 (Exact Date)   BMI 21.93 kg/m  CONSTITUTIONAL: Well-developed, well-nourished female in no acute distress.  HENT:  Normocephalic, atraumatic, External right and left ear normal. Oropharynx is clear and moist EYES: Conjunctivae and EOM are normal. Pupils are equal, round, and reactive to light. No scleral icterus.  NECK: Normal range of motion, supple, no masses.  Normal thyroid.  SKIN: Skin is warm and dry. No rash noted. Not diaphoretic. No erythema. No pallor. MUSCULOSKELETAL: Normal range of motion. No tenderness.  No cyanosis, clubbing, or edema.  2+ distal pulses. NEUROLOGIC: Alert and oriented to person, place, and time. Normal reflexes, muscle tone coordination. No cranial nerve deficit noted. PSYCHIATRIC: Normal mood and affect. Normal behavior. Normal judgment and thought content. CARDIOVASCULAR: Normal heart rate noted, regular rhythm RESPIRATORY: Clear to auscultation bilaterally. Effort and breath sounds normal, no problems with respiration noted. BREASTS: Symmetric in size. No masses, skin changes, nipple drainage, or lymphadenopathy.   Assessment and Plan:  1. Encounter for fertility planning --Patient to track menstrual cycle and vaginal discharge, schedule intercourse for every other day during times marked as "fertile" on her current app. --Return to clinic after 3-6 months of focused fertility tracking and intercourse as described  above if no pregnancy achieved --Reinforced that it is normal to need focused attention on ovulation to achieve pregnancy  2. Nipple discharge in female --Patient initiates significant nipple stimulation when conversing --Discussed that stress, some medication and overstimulation can trigger discharge --Patient to track timing, quantity and character of discharge to allow better transparency into extent of complaint  Declined STI screening today. Referred to health department for screening PRN.  Routine preventative health maintenance measures emphasized. Please refer to After Visit Summary for other counseling recommendations.   Clayton BiblesSamantha Esabella Stockinger, CNM [facility], [facility]

## 2018-06-26 NOTE — Progress Notes (Signed)
Pt states breasts are hard and have discharge. Discuss why she is not getting pregnant.

## 2018-07-17 NOTE — L&D Delivery Note (Addendum)
OB/GYN Faculty Practice Delivery Note  Sandra Palmer is a 23 y.o. G1P0000 s/p SVD at [redacted]w[redacted]d. She was admitted for preterm labor.   ROM: 0h 2m with clearfluid GBS Status: GBS status unknown and received penicillin  Maximum Maternal Temperature: 98.7 F  Labor Progress: . Patient presented to L&D for preterm labor. Initial SVE was 4/90/0.  Labor course course significant for tocolysis with magnesium sulfate and appropriate BMZ x 2.  She then progressed to complete augmented only with AROM immediately prior to delivery.   Delivery Date/Time: 05/27/19 at 2340  Delivery: Called to room and patient was complete and pushing. Head delivered LOA. No nuchal cord present. Shoulder and body delivered in usual fashion. Infant with spontaneous cry, placed on mother's abdomen, dried and stimulated. Cord clamped x 2 after 1-minute delay, and cut by father with supervision by myself. Cord blood drawn. Placenta delivered spontaneously with gentle cord traction. Fundus firm with massage and Pitocin. Labia, perineum, vagina, and cervix inspected inspected with no lacerations present .  Baby Weight: @BABYWGTOZEBC @  Placenta: Sent to L&D Complications: None Lacerations:  None  EBL: 100 ml  Analgesia: None    Infant: APGAR (1 MIN): 8   APGAR (5 MINS): 9   APGAR (10 MINS):     Maxie Better, MD, PGY1  OBGYN Faculty Teaching Service  05/28/2019, 12:21 AM    Attestation:  I confirm that I have verified the information documented in the resident's note and that I have also personally reperformed the physical exam and all medical decision making activities.   I was gloved and present for entire delivery NICU team present prior to delivery SVD without incident No difficulty with shoulders No lacerations  Baby did well and was taken to NICU Mom will be transferred to [facility] SCU  Seabron Spates, CNM

## 2018-10-02 ENCOUNTER — Other Ambulatory Visit: Payer: Self-pay

## 2018-10-02 ENCOUNTER — Ambulatory Visit (INDEPENDENT_AMBULATORY_CARE_PROVIDER_SITE_OTHER): Payer: No Typology Code available for payment source | Admitting: Advanced Practice Midwife

## 2018-10-02 ENCOUNTER — Other Ambulatory Visit (HOSPITAL_COMMUNITY)
Admission: RE | Admit: 2018-10-02 | Discharge: 2018-10-02 | Disposition: A | Discharge status: Home / Self Care | Payer: No Typology Code available for payment source | Source: Ambulatory Visit | Attending: Advanced Practice Midwife | Admitting: Advanced Practice Midwife

## 2018-10-02 ENCOUNTER — Encounter: Payer: Self-pay | Admitting: Advanced Practice Midwife

## 2018-10-02 VITALS — BP 139/78 | HR 96 | Wt 144.0 lb

## 2018-10-02 DIAGNOSIS — Z3202 Encounter for pregnancy test, result negative: Secondary | ICD-10-CM

## 2018-10-02 DIAGNOSIS — N6452 Nipple discharge: Secondary | ICD-10-CM

## 2018-10-02 DIAGNOSIS — R109 Unspecified abdominal pain: Secondary | ICD-10-CM | POA: Diagnosis present

## 2018-10-02 LAB — POCT URINE PREGNANCY: Preg Test, Ur: NEGATIVE

## 2018-10-02 MED ORDER — MICONAZOLE NITRATE 2 % EX CREA: 1.0000 "application " | TOPICAL_CREAM | Freq: Two times a day (BID) | CUTANEOUS | 0 refills | Status: AC

## 2018-10-02 NOTE — Progress Notes (Signed)
GYNECOLOGY PROBLEM ENCOUNTER NOTE  History:     Sandra Palmer is a 23 y.o. G0P0000 female here for evaluation of abdominal pain. This is a recurrent problem. Patient endorses three episodes in the past year of RLQ pain. She describes her pain as "really bad". She is able to sleep through her pain. She is managing these episodes with Tylenol. She denies abnormal vaginal bleeding, discharge, pelvic pain, problems with intercourse or other gynecologic concerns.  She verbalizes that she is still trying to achieve pregnancy and she is concerned because all her friends tell her she looks pregnant but her home pregnancy tests are negative. She reports LMP of 09/09/2018.  Patient endorses recurrent nipple discharge. This has been a problem since the middle of last year. She states she has "tasted it" and does not believe it is yeast. She acknowledges she was previously advised to minimize self stimulation of her breasts but this is difficult for her to do.   Gynecologic History Patient's last menstrual period was 09/09/2018 (exact date). Contraception: none Desires pregnancy Last Pap: 02/01/2018. Results were: normal with negative HPV  Obstetric History OB History  Gravida Para Term Preterm AB Living  0 0 0 0 0 0  SAB TAB Ectopic Multiple Live Births  0 0 0 0      Past Medical History:  Diagnosis Date  . Allergy   . Chlamydia 05/18/2015  . History of Papanicolaou smear of cervix 05/18/2015   Nil/Pos    Past Surgical History:  Procedure Laterality Date  . HERNIA REPAIR      Current Outpatient Medications on File Prior to Visit  Medication Sig Dispense Refill  . Multiple Vitamins-Calcium (ONE-A-DAY WOMENS PO) Take by mouth.     No current facility-administered medications on file prior to visit.     Allergies  Allergen Reactions  . Azithromycin Swelling and Rash    Social History:  reports that she has never smoked. She has never used smokeless tobacco. She reports previous  alcohol use. She reports that she does not use drugs.  History reviewed. No pertinent family history.  The following portions of the patient's history were reviewed and updated as appropriate: allergies, current medications, past family history, past medical history, past social history, past surgical history and problem list.  Review of Systems Pertinent items noted in HPI and remainder of comprehensive ROS otherwise negative.  Physical Exam:  BP 139/78   Pulse 96   Wt 144 lb (65.3 kg)   LMP 09/09/2018 (Exact Date)   BMI 22.55 kg/m  CONSTITUTIONAL: Well-developed, well-nourished female in no acute distress.  HENT:  Normocephalic, atraumatic, External right and left ear normal. Oropharynx is clear and moist EYES: Conjunctivae and EOM are normal. Pupils are equal, round, and reactive to light. No scleral icterus.  NECK: Normal range of motion, supple, no masses.  Normal thyroid.  SKIN: Skin is warm and dry. No rash noted. Not diaphoretic. No erythema. No pallor. MUSCULOSKELETAL: Normal range of motion. No tenderness.  No cyanosis, clubbing, or edema.  2+ distal pulses. NEUROLOGIC: Alert and oriented to person, place, and time. Normal reflexes, muscle tone coordination. No cranial nerve deficit noted. PSYCHIATRIC: Normal mood and affect. Normal behavior. Normal judgment and thought content. CARDIOVASCULAR: Normal heart rate noted, regular rhythm RESPIRATORY: Effort normal, no problems with respiration noted. BREASTS: Symmetric in size. No masses, skin changes or lymphadenopathy. Scant amount of white drainage visible on both nipples near border with areola. Skin appears abraded. No purulent drainage  noted coming directly from nipple. ABDOMEN: Soft no distention noted.  No tenderness, rebound or guarding.    Assessment and Plan:    1. Abdominal pain, unspecified abdominal location  - Cervicovaginal ancillary only( [place]) - Urine Culture - POCT urine pregnancy  2. Nipple  discharge in female - Concern for yeast. Advised patient to allow time for air circulation, loose clothing, ensure skin is dry after showering, avoid scented body spray. - Rx Miconazole  Routine preventative health maintenance measures emphasized. Please refer to After Visit Summary for other counseling recommendations.      Total visit time 20 minutes. Greater than 50% spent in counseling and coordination of care.  Clayton Bibles, CNM [employer] for [employer], [facility]

## 2018-10-03 LAB — URINE CULTURE

## 2018-10-05 ENCOUNTER — Other Ambulatory Visit: Payer: Self-pay | Admitting: Advanced Practice Midwife

## 2018-10-05 LAB — CERVICOVAGINAL ANCILLARY ONLY
Bacterial vaginitis: POSITIVE — AB
Candida vaginitis: NEGATIVE
Chlamydia: NEGATIVE
Neisseria Gonorrhea: NEGATIVE
Trichomonas: NEGATIVE

## 2018-10-05 MED ORDER — METRONIDAZOLE 500 MG PO TABS: 500.0000 mg | ORAL_TABLET | Freq: Two times a day (BID) | ORAL | 0 refills | Status: DC

## 2018-10-05 NOTE — Progress Notes (Signed)
+   BV, rx to pharmacy, Patient notified via active MyChart  Sandra Palmer, [place] 10/05/18 8:04 PM

## 2018-11-28 ENCOUNTER — Other Ambulatory Visit (HOSPITAL_COMMUNITY)
Admission: RE | Admit: 2018-11-28 | Discharge: 2018-11-28 | Disposition: A | Discharge status: Home / Self Care | Payer: No Typology Code available for payment source | Source: Ambulatory Visit | Attending: Obstetrics & Gynecology | Admitting: Obstetrics & Gynecology

## 2018-11-28 ENCOUNTER — Ambulatory Visit (INDEPENDENT_AMBULATORY_CARE_PROVIDER_SITE_OTHER): Payer: No Typology Code available for payment source | Admitting: Obstetrics & Gynecology

## 2018-11-28 ENCOUNTER — Other Ambulatory Visit: Payer: Self-pay

## 2018-11-28 ENCOUNTER — Encounter: Payer: Self-pay | Admitting: Obstetrics & Gynecology

## 2018-11-28 VITALS — BP 124/86 | HR 72 | Wt 141.0 lb

## 2018-11-28 DIAGNOSIS — O26891 Other specified pregnancy related conditions, first trimester: Secondary | ICD-10-CM | POA: Diagnosis not present

## 2018-11-28 DIAGNOSIS — N898 Other specified noninflammatory disorders of vagina: Secondary | ICD-10-CM | POA: Diagnosis present

## 2018-11-28 DIAGNOSIS — N760 Acute vaginitis: Secondary | ICD-10-CM

## 2018-11-28 MED ORDER — TERCONAZOLE 0.8 % VA CREA: 1.0000 | TOPICAL_CREAM | Freq: Every day | VAGINAL | 1 refills | Status: AC

## 2018-11-28 NOTE — Progress Notes (Signed)
    OFFICE VISIT NOTE  History:   Sandra Palmer is a 23 y.o. G1P0 at approximately [redacted] weeks gestation here today for evaluation of vulvar irritation and abnormal vaginal discharge x 3 days.  Feels it is a severe yeast infection.. She denies any abnormal vaginal bleeding, pelvic pain or other concerns.  Already scheduled for her upcoming OB visit.    Past Medical History:  Diagnosis Date  . Allergy   . Chlamydia 05/18/2015  . History of Papanicolaou smear of cervix 05/18/2015   Nil/Pos    Past Surgical History:  Procedure Laterality Date  . HERNIA REPAIR      The following portions of the patient's history were reviewed and updated as appropriate: allergies, current medications, past family history, past medical history, past social history, past surgical history and problem list.   Health Maintenance:  Normal pap and negative HRHPV on 02/01/2018.   Review of Systems:  Pertinent items noted in HPI and remainder of comprehensive ROS otherwise negative.  Physical Exam:  BP 124/86   Pulse 72   Wt 141 lb (64 kg)   BMI 22.08 kg/m  CONSTITUTIONAL: Well-developed, well-nourished female in no acute distress.  HEENT:  Normocephalic, atraumatic. External right and left ear normal. No scleral icterus.  NECK: Normal range of motion, supple, no masses noted on observation SKIN: No rash noted. Not diaphoretic. No erythema. No pallor. MUSCULOSKELETAL: Normal range of motion. No edema noted. NEUROLOGIC: Alert and oriented to person, place, and time. Normal muscle tone coordination. No cranial nerve deficit noted. PSYCHIATRIC: Normal mood and affect. Normal behavior. Normal judgment and thought content. CARDIOVASCULAR: Normal heart rate noted RESPIRATORY: Effort and breath sounds normal, no problems with respiration noted ABDOMEN: No masses noted. No other overt distention noted.   PELVIC: External genitalia with increased erythema and swelling, thick white curdy discharge noted on vulva and  distal vagina.     Assessment and Plan:    1. Vaginal discharge during pregnancy in first trimester 2. Vulvovaginitis Likely yeast vulvovaginitis, Terazol prescribed.  Proper vulvar hygiene emphasized: discussed avoidance of perfumed soaps, detergents, lotions and any type of douches; in addition to wearing cotton underwear and no underwear at night.  Also recommended cleaning front to back, voiding and cleaning up after intercourse.  - Cervicovaginal ancillary only( [place]) - terconazole (TERAZOL 3) 0.8 % vaginal cream; Place 1 applicator vaginally at bedtime for 3 days. Apply nightly for three nights.  Dispense: 20 g; Refill: 1  Please refer to After Visit Summary for other counseling recommendations.   Return for OFFICE New  OB Visit as scheduled.    Total face-to-face time with patient: 20 minutes.  Over 50% of encounter was spent on counseling and coordination of care.   Jaynie Collins, MD, FACOG Obstetrician & Gynecologist, [facility] for [employer], [facility]

## 2018-11-28 NOTE — Patient Instructions (Signed)
Vaginitis  Vaginitis is a condition in which the vaginal tissue swells and becomes red (inflamed). This condition is most often caused by a change in the normal balance of bacteria and yeast that live in the vagina. This change causes an overgrowth of certain bacteria or yeast, which causes the inflammation. There are different types of vaginitis, but the most common types are:   Bacterial vaginosis.   Yeast infection (candidiasis).   Trichomoniasis vaginitis. This is a sexually transmitted disease (STD).   Viral vaginitis.   Atrophic vaginitis.   Allergic vaginitis.  What are the causes?  The cause of this condition depends on the type of vaginitis. It can be caused by:   Bacteria (bacterial vaginosis).   Yeast, which is a fungus (yeast infection).   A parasite (trichomoniasis vaginitis).   A virus (viral vaginitis).   Low hormone levels (atrophic vaginitis). Low hormone levels can occur during pregnancy, breastfeeding, or after menopause.   Irritants, such as bubble baths, scented tampons, and feminine sprays (allergic vaginitis).  Other factors can change the normal balance of the yeast and bacteria that live in the vagina. These include:   Antibiotic medicines.   Poor hygiene.   Diaphragms, vaginal sponges, spermicides, birth control pills, and intrauterine devices (IUD).   Sex.   Infection.   Uncontrolled diabetes.   A weakened defense (immune) system.  What increases the risk?  This condition is more likely to develop in women who:   Smoke.   Use vaginal douches, scented tampons, or scented sanitary pads.   Wear tight-fitting pants.   Wear thong underwear.   Use oral birth control pills or an IUD.   Have sex without a condom.   Have multiple sex partners.   Have an STD.   Frequently use the spermicide nonoxynol-9.   Eat lots of foods high in sugar.   Have uncontrolled diabetes.   Have low estrogen levels.   Have a weakened immune system from an immune disorder or medical  treatment.   Are pregnant or breastfeeding.  What are the signs or symptoms?  Symptoms vary depending on the cause of the vaginitis. Common symptoms include:   Abnormal vaginal discharge.  ? The discharge is white, gray, or yellow with bacterial vaginosis.  ? The discharge is thick, white, and cheesy with a yeast infection.  ? The discharge is frothy and yellow or greenish with trichomoniasis.   A bad vaginal smell. The smell is fishy with bacterial vaginosis.   Vaginal itching, pain, or swelling.   Sex that is painful.   Pain or burning when urinating.  Sometimes there are no symptoms.  How is this diagnosed?  This condition is diagnosed based on your symptoms and medical history. A physical exam, including a pelvic exam, will also be done. You may also have other tests, including:   Tests to determine the pH level (acidity or alkalinity) of your vagina.   A whiff test, to assess the odor that results when a sample of your vaginal discharge is mixed with a potassium hydroxide solution.   Tests of vaginal fluid. A sample will be examined under a microscope.  How is this treated?  Treatment varies depending on the type of vaginitis you have. Your treatment may include:   Antibiotic creams or pills to treat bacterial vaginosis and trichomoniasis.   Antifungal medicines, such as vaginal creams or suppositories, to treat a yeast infection.   Medicine to ease discomfort if you have viral vaginitis. Your sexual partner   should also be treated.   Estrogen delivered in a cream, pill, suppository, or vaginal ring to treat atrophic vaginitis. If vaginal dryness occurs, lubricants and moisturizing creams may help. You may need to avoid scented soaps, sprays, or douches.   Stopping use of a product that is causing allergic vaginitis. Then using a vaginal cream to treat the symptoms.  Follow these instructions at home:  Lifestyle   Keep your genital area clean and dry. Avoid soap, and only rinse the area with  water.   Do not douche or use tampons until your health care provider says it is okay to do so. Use sanitary pads, if needed.   Do not have sex until your health care provider approves. When you can return to sex, practice safe sex and use condoms.   Wipe from front to back. This avoids the spread of bacteria from the rectum to the vagina.  General instructions   Take over-the-counter and prescription medicines only as told by your health care provider.   If you were prescribed an antibiotic medicine, take or use it as told by your health care provider. Do not stop taking or using the antibiotic even if you start to feel better.   Keep all follow-up visits as told by your health care provider. This is important.  How is this prevented?   Use mild, non-scented products. Do not use things that can irritate the vagina, such as fabric softeners. Avoid the following products if they are scented:  ? Feminine sprays.  ? Detergents.  ? Tampons.  ? Feminine hygiene products.  ? Soaps or bubble baths.   Let air reach your genital area.  ? Wear cotton underwear to reduce moisture buildup.  ? Avoid wearing underwear while you sleep.  ? Avoid wearing tight pants and underwear or nylons without a cotton panel.  ? Avoid wearing thong underwear.   Take off any wet clothing, such as bathing suits, as soon as possible.   Practice safe sex and use condoms.  Contact a health care provider if:   You have abdominal pain.   You have a fever.   You have symptoms that last for more than 2-3 days.  Get help right away if:   You have a fever and your symptoms suddenly get worse.  Summary   Vaginitis is a condition in which the vaginal tissue becomes inflamed.This condition is most often caused by a change in the normal balance of bacteria and yeast that live in the vagina.   Treatment varies depending on the type of vaginitis you have.   Do not douche, use tampons , or have sex until your health care provider approves. When  you can return to sex, practice safe sex and use condoms.  This information is not intended to replace advice given to you by your health care provider. Make sure you discuss any questions you have with your health care provider.  Document Released: 04/30/2007 Document Revised: 08/08/2016 Document Reviewed: 08/08/2016  Elsevier Interactive Patient Education  2019 Elsevier Inc[employer]

## 2018-11-28 NOTE — Progress Notes (Signed)
labPatient reports vaginal discharge and irritation for several days now. She is raw and would like to be check for vaginal yeast. She reports being [redacted] weeks pregnant and will start care here in a couple weeks.

## 2018-12-05 LAB — CERVICOVAGINAL ANCILLARY ONLY
Bacterial vaginitis: POSITIVE — AB
Candida vaginitis: POSITIVE — AB
Chlamydia: NEGATIVE
Neisseria Gonorrhea: NEGATIVE
Trichomonas: NEGATIVE

## 2018-12-05 MED ORDER — METRONIDAZOLE 500 MG PO TABS: 500.0000 mg | ORAL_TABLET | Freq: Two times a day (BID) | ORAL | 1 refills | Status: DC

## 2018-12-05 NOTE — Addendum Note (Signed)
Addended by: Jaynie Collins A on: 12/05/2018 04:52 PM   Modules accepted: Orders

## 2018-12-19 ENCOUNTER — Telehealth: Payer: Self-pay

## 2018-12-19 NOTE — Telephone Encounter (Signed)
Received called from patient requesting advice about vomiting/nausea in pregnancy. Patient reports vomiting 1-2 times per day. She is able to keep some foods down and liquids. I have advised patient to try small meals more often. She can try toast,ginger ale,ice chips,crackers,jello and yogurt. I have advised her to try ginger or peppermint candy to see if that helps. If her vomiting gets worse or she is not able to keep anything down she should go to Women's/Children unit to be check out. Patient has a new OB appointment next Thursday and will follow up then.

## 2018-12-26 ENCOUNTER — Encounter: Payer: Self-pay | Admitting: Obstetrics and Gynecology

## 2018-12-26 ENCOUNTER — Ambulatory Visit (INDEPENDENT_AMBULATORY_CARE_PROVIDER_SITE_OTHER): Payer: Medicaid Other | Admitting: Obstetrics and Gynecology

## 2018-12-26 ENCOUNTER — Other Ambulatory Visit (HOSPITAL_COMMUNITY)
Admission: RE | Admit: 2018-12-26 | Discharge: 2018-12-26 | Disposition: A | Discharge status: Home / Self Care | Payer: Medicaid Other | Source: Ambulatory Visit | Attending: Obstetrics and Gynecology | Admitting: Obstetrics and Gynecology

## 2018-12-26 ENCOUNTER — Other Ambulatory Visit: Payer: Self-pay

## 2018-12-26 DIAGNOSIS — Z3201 Encounter for pregnancy test, result positive: Secondary | ICD-10-CM

## 2018-12-26 DIAGNOSIS — Z3480 Encounter for supervision of other normal pregnancy, unspecified trimester: Secondary | ICD-10-CM | POA: Diagnosis present

## 2018-12-26 DIAGNOSIS — Z3A11 11 weeks gestation of pregnancy: Secondary | ICD-10-CM

## 2018-12-26 DIAGNOSIS — Z3481 Encounter for supervision of other normal pregnancy, first trimester: Secondary | ICD-10-CM

## 2018-12-26 LAB — POCT URINE PREGNANCY: Preg Test, Ur: POSITIVE — AB

## 2018-12-26 MED ORDER — PREPLUS 27-1 MG PO TABS: 1.0000 | ORAL_TABLET | Freq: Every day | ORAL | 13 refills | Status: DC

## 2018-12-26 MED ORDER — BLOOD PRESSURE MONITOR KIT: 1.0000 | PACK | Freq: Every day | 0 refills | Status: AC

## 2018-12-26 NOTE — Progress Notes (Signed)
Has had some mild nausea.

## 2018-12-26 NOTE — Patient Instructions (Addendum)
Your Due Date is July 17, 2019  Today you are 11 weeks and 0 days   [email or web address]

## 2018-12-26 NOTE — Progress Notes (Signed)
New OB Note  12/26/2018   [facility]  Chief Complaint: NOB  Transfer of Care Patient: no  History of Present Illness: Ms. Sandra RoughHicks is a 23 y.o. G1P0000 @ 11/0 weeks (EDC 12/31, based on Patient's last menstrual period was 10/10/2018.11wk u/s today ).  Preg complicated by has Allergic rhinitis and Supervision of other normal pregnancy, antepartum on their problem list.   Any events prior to today's visit: no Her periods were: qmonth, regular She was using no method when she  conceived.  She has mild signs or symptoms of nausea/vomiting of pregnancy. She has Negative signs or symptoms of miscarriage or preterm labor On any medications around the time she conceived/early pregnancy: No   ROS: A 12-point review of systems was performed and negative, except as stated in the above HPI.  OBGYN History: As per HPI. OB History  Gravida Para Term Preterm AB Living  1 0 0 0 0 0  SAB TAB Ectopic Multiple Live Births  0 0 0 0      # Outcome Date GA Lbr Len/2nd Weight Sex Delivery Anes PTL Lv  1 Current             History of pap smears: Yes. Last pap smear 2019 and results were negative   Past Medical History: Past Medical History:  Diagnosis Date  . Allergy   . Chlamydia 05/18/2015  . History of Papanicolaou smear of cervix 05/18/2015   Nil/Pos    Past Surgical History: Past Surgical History:  Procedure Laterality Date  . HERNIA REPAIR      Family History:  No family history on file.  Social History:  Social History   Socioeconomic History  . Marital status: Single    Spouse name: Not on file  . Number of children: Not on file  . Years of education: Not on file  . Highest education level: Not on file  Occupational History  . Not on file  Social Needs  . Financial resource strain: Not on file  . Food insecurity    Worry: Not on file    Inability: Not on file  . Transportation needs    Medical: Not on file    Non-medical: Not on file   Tobacco Use  . Smoking status: Never Smoker  . Smokeless tobacco: Never Used  Substance and Sexual Activity  . Alcohol use: Not Currently  . Drug use: No  . Sexual activity: Yes    Partners: Male    Birth control/protection: None  Lifestyle  . Physical activity    Days per week: Not on file    Minutes per session: Not on file  . Stress: Not on file  Relationships  . Social Musicianconnections    Talks on phone: Not on file    Gets together: Not on file    Attends religious service: Not on file    Active member of club or organization: Not on file    Attends meetings of clubs or organizations: Not on file    Relationship status: Not on file  . Intimate partner violence    Fear of current or ex partner: Not on file    Emotionally abused: Not on file    Physically abused: Not on file    Forced sexual activity: Not on file  Other Topics Concern  . Not on file  Social History Narrative   Lives with mother    Allergy: Allergies  Allergen Reactions  . Azithromycin Swelling and Rash  Health Maintenance:  Mammogram Up to Date: not applicable  Current Outpatient Medications: PNV  Physical Exam:   BP 125/83   Pulse 94   Wt 137 lb 8 oz (62.4 kg)   LMP 10/10/2018   BMI 21.54 kg/m  Body mass index is 21.54 kg/m.   Vag. Bleeding: None. Fundal height: not applicable FHTs: 696E  General appearance: Well nourished, well developed female in no acute distress.  Cardiovascular: S1, S2 normal, no murmur, rub or gallop, regular rate and rhythm Respiratory:  Clear to auscultation bilateral. Normal respiratory effort Abdomen: positive bowel sounds and no masses, hernias; diffusely non tender to palpation, non distended Neuro/Psych:  Normal mood and affect.  Skin:  Warm and dry.  Lymphatic:  No inguinal lymphadenopathy.   Pelvic exam: is not limited by body habitus EGBUS: within normal limits, Vagina: within normal limits and with no blood in the vault, Cervix: normal appearing  cervix without discharge or lesions, closed/long/high, Uterus:  enlarged, c/w 10-12 week size, and Adnexa:  normal adnexa and no mass, fullness, tenderness  Laboratory: none  Imaging:  Bedside u/s: SLIUP, CRL 12/0, FHR 160s, subj normal AF  Assessment: pt doing well  Plan: 1. Supervision of other normal pregnancy, antepartum Routine care. Genetics, NOB labs  Problem list reviewed and updated.  Follow up in 4 weeks.  >50% of 20 min visit spent on counseling and coordination of care.     Durene Romans MD Attending Center for [facility])

## 2018-12-27 LAB — OBSTETRIC PANEL, INCLUDING HIV
Antibody Screen: NEGATIVE
Basophils Absolute: 0 10*3/uL (ref 0.0–0.2)
Basos: 0 %
EOS (ABSOLUTE): 0.1 10*3/uL (ref 0.0–0.4)
Eos: 1 %
HIV Screen 4th Generation wRfx: NONREACTIVE
Hematocrit: 39.9 % (ref 34.0–46.6)
Hemoglobin: 13.6 g/dL (ref 11.1–15.9)
Hepatitis B Surface Ag: NEGATIVE
Immature Grans (Abs): 0 10*3/uL (ref 0.0–0.1)
Immature Granulocytes: 0 %
Lymphocytes Absolute: 1.8 10*3/uL (ref 0.7–3.1)
Lymphs: 20 %
MCH: 29 pg (ref 26.6–33.0)
MCHC: 34.1 g/dL (ref 31.5–35.7)
MCV: 85 fL (ref 79–97)
Monocytes Absolute: 0.5 10*3/uL (ref 0.1–0.9)
Monocytes: 5 %
Neutrophils Absolute: 6.8 10*3/uL (ref 1.4–7.0)
Neutrophils: 74 %
Platelets: 256 10*3/uL (ref 150–450)
RBC: 4.69 x10E6/uL (ref 3.77–5.28)
RDW: 13.3 % (ref 11.7–15.4)
RPR Ser Ql: NONREACTIVE
Rh Factor: POSITIVE
Rubella Antibodies, IGG: 22.1 index (ref >0.99)
WBC: 9.3 10*3/uL (ref 3.4–10.8)

## 2018-12-27 LAB — CERVICOVAGINAL ANCILLARY ONLY
Chlamydia: NEGATIVE
Neisseria Gonorrhea: NEGATIVE
Trichomonas: NEGATIVE

## 2018-12-28 LAB — URINE CULTURE, OB REFLEX

## 2018-12-28 LAB — CULTURE, OB URINE

## 2019-01-06 ENCOUNTER — Encounter: Payer: Self-pay | Admitting: *Deleted

## 2019-01-23 ENCOUNTER — Other Ambulatory Visit: Payer: Self-pay

## 2019-01-23 ENCOUNTER — Ambulatory Visit (INDEPENDENT_AMBULATORY_CARE_PROVIDER_SITE_OTHER): Payer: Medicaid Other | Admitting: Obstetrics and Gynecology

## 2019-01-23 DIAGNOSIS — Z3A15 15 weeks gestation of pregnancy: Secondary | ICD-10-CM

## 2019-01-23 DIAGNOSIS — Z3480 Encounter for supervision of other normal pregnancy, unspecified trimester: Secondary | ICD-10-CM

## 2019-01-23 DIAGNOSIS — D563 Thalassemia minor: Secondary | ICD-10-CM | POA: Insufficient documentation

## 2019-01-23 DIAGNOSIS — O99830 Other infection carrier state complicating pregnancy: Secondary | ICD-10-CM

## 2019-01-23 HISTORY — DX: Thalassemia minor: D56.3

## 2019-01-23 NOTE — Progress Notes (Signed)
Prenatal Visit Note Date: 01/23/2019 Clinic: [facility]  Subjective:  Sandra Palmer is a 23 y.o. G1P0000 at [redacted]w[redacted]d being seen today for ongoing prenatal care.  She is currently monitored for the following issues for this low-risk pregnancy and has Allergic rhinitis; Supervision of other normal pregnancy, antepartum; and Alpha thalassemia silent carrier on their problem list.  Patient reports no complaints.    . Vag. Bleeding: None.  Movement: Present. Denies leaking of fluid.   The following portions of the patient's history were reviewed and updated as appropriate: allergies, current medications, past family history, past medical history, past social history, past surgical history and problem list. Problem list updated.  Objective:   Vitals:   01/23/19 0856  BP: 126/75  Pulse: 72  Weight: 140 lb (63.5 kg)    Fetal Status: Fetal Heart Rate (bpm): 154   Movement: Present     General:  Alert, oriented and cooperative. Patient is in no acute distress.  Skin: Skin is warm and dry. No rash noted.   Cardiovascular: Normal heart rate noted  Respiratory: Normal respiratory effort, no problems with respiration noted  Abdomen: Soft, gravid, appropriate for gestational age. Pain/Pressure: Absent     Pelvic:  Cervical exam deferred        Extremities: Normal range of motion.  Edema: None  Mental Status: Normal mood and affect. Normal behavior. Normal judgment and thought content.   Urinalysis:      Assessment and Plan:  Pregnancy: G1P0000 at [redacted]w[redacted]d  1. Alpha thalassemia silent carrier Routine care. D/w her re: this and can have fob tested here with [place]. Pt declines afp. Anatomy u/s already scheduled. Will try and get bp cuff and set up for virtual visits.   Preterm labor symptoms and general obstetric precautions including but not limited to vaginal bleeding, contractions, leaking of fluid and fetal movement were reviewed in detail with the patient. Please refer to After  Visit Summary for other counseling recommendations.  Return in about 4 weeks (around 02/20/2019).   Aletha Halim, MD

## 2019-01-24 ENCOUNTER — Other Ambulatory Visit: Payer: Self-pay

## 2019-01-24 NOTE — Telephone Encounter (Signed)
Patient information has been submitted to [facility] to obtain a blood pressure monitor.

## 2019-02-03 ENCOUNTER — Telehealth: Payer: Medicaid Other | Admitting: Physician Assistant

## 2019-02-03 ENCOUNTER — Encounter: Payer: Self-pay | Admitting: Physician Assistant

## 2019-02-03 ENCOUNTER — Telehealth: Payer: Self-pay

## 2019-02-03 DIAGNOSIS — Z20822 Contact with and (suspected) exposure to covid-19: Secondary | ICD-10-CM

## 2019-02-03 DIAGNOSIS — R432 Parageusia: Secondary | ICD-10-CM

## 2019-02-03 DIAGNOSIS — R059 Cough, unspecified: Secondary | ICD-10-CM

## 2019-02-03 DIAGNOSIS — R05 Cough: Secondary | ICD-10-CM

## 2019-02-03 MED ORDER — BENZONATATE 100 MG PO CAPS: 100.0000 mg | ORAL_CAPSULE | Freq: Three times a day (TID) | ORAL | 0 refills | Status: DC | PRN

## 2019-02-03 NOTE — Progress Notes (Signed)
E-Visit for Corona Virus Screening   Your current symptoms could be consistent with the coronavirus.  Many health care providers can now test patients at their office but not all are.  [facility] has multiple testing sites. For information on our COVID testing locations and hours go to [email or web address]  Please quarantine yourself while awaiting your test results.  We are enrolling you in our MyChart Home Montioring for COVID19 . Daily you will receive a questionnaire within the MyChart website. Our COVID 19 response team willl be monitoriing your responses daily.    COVID-19 is a respiratory illness with symptoms that are similar to the flu. Symptoms are typically mild to moderate, but there have been cases of severe illness and death due to the virus. The following symptoms may appear 2-14 days after exposure: . Fever . Cough . Shortness of breath or difficulty breathing . Chills . Repeated shaking with chills . Muscle pain . Headache . Sore throat . New loss of taste or smell . Fatigue . Congestion or runny nose . Nausea or vomiting . Diarrhea  It is vitally important that if you feel that you have an infection such as this virus or any other virus that you stay home and away from places where you may spread it to others.  You should self-quarantine for 14 days if you have symptoms that could potentially be coronavirus or have been in close contact a with a person diagnosed with COVID-19 within the last 2 weeks. You should avoid contact with people age 65 and older.   You should wear a mask or cloth face covering over your nose and mouth if you must be around other people or animals, including pets (even at home). Try to stay at least 6 feet away from other people. This will protect the people around you.  You can use medication such as A prescription cough medication called Tessalon Perles 100 mg. You may take 1-2 capsules every 8 hours as needed for  cough  You may also take acetaminophen (Tylenol) as needed for fever.  I have provided a work note  You have been enrolled in Evisit Covid19 Home Monitoring questionnaire, which will contact you regarding your symptoms.   Reduce your risk of any infection by using the same precautions used for avoiding the common cold or flu:  . Wash your hands often with soap and warm water for at least 20 seconds.  If soap and water are not readily available, use an alcohol-based hand sanitizer with at least 60% alcohol.  . If coughing or sneezing, cover your mouth and nose by coughing or sneezing into the elbow areas of your shirt or coat, into a tissue or into your sleeve (not your hands). . Avoid shaking hands with others and consider head nods or verbal greetings only. . Avoid touching your eyes, nose, or mouth with unwashed hands.  . Avoid close contact with people who are sick. . Avoid places or events with large numbers of people in one location, like concerts or sporting events. . Carefully consider travel plans you have or are making. . If you are planning any travel outside or inside the [place], visit the CDC's Travelers' Health webpage for the latest health notices. . If you have some symptoms but not all symptoms, continue to monitor at home and seek medical attention if your symptoms worsen. . If you are having a medical emergency, call 911.  HOME CARE . Only take medications as instructed by your medical   team. . Drink plenty of fluids and get plenty of rest. . A steam or ultrasonic humidifier can help if you have congestion.   GET HELP RIGHT AWAY IF YOU HAVE EMERGENCY WARNING SIGNS** FOR COVID-19. If you or someone is showing any of these signs seek emergency medical care immediately. Call 911 or proceed to your closest emergency facility if: . You develop worsening high fever. . Trouble breathing . Bluish lips or face . Persistent pain or pressure in the chest . New confusion . Inability  to wake or stay awake . You cough up blood. . Your symptoms become more severe  **This list is not all possible symptoms. Contact your medical provider for any symptoms that are sever or concerning to you.   MAKE SURE YOU   Understand these instructions.  Will watch your condition.  Will get help right away if you are not doing well or get worse.  Your e-visit answers were reviewed by a board certified advanced clinical practitioner to complete your personal care plan.  Depending on the condition, your plan could have included both over the counter or prescription medications.  If there is a problem please reply once you have received a response from your provider.  Your safety is important to us.  If you have drug allergies check your prescription carefully.    You can use MyChart to ask questions about today's visit, request a non-urgent call back, or ask for a work or school excuse for 24 hours related to this e-Visit. If it has been greater than 24 hours you will need to follow up with your provider, or enter a new e-Visit to address those concerns. You will get an e-mail in the next two days asking about your experience.  I hope that your e-visit has been valuable and will speed your recovery. Thank you for using e-visits.   I spent 5-10 minutes on review and completion of this note- Sahar Osman PAC  

## 2019-02-03 NOTE — Telephone Encounter (Signed)
Received call from patient today wanting to know how she could get her sense of taste and smell back. She reports it is completely gone away. I ask patient if she was having any other symptoms and patient stated no. I have advised patient to request a e-visit with my chart to see if she should be tested for covid-19. Advised patient I would call her back once I hear from our provider here in the office. Patient voice understanding at this time.   Call patient this afternoon to let her know it is recommended she go for covid-19 after reading the provider note and discussing with the provider here. No answer left message for her to call us back. Order has been placed and patient can go to [facility] location to be screen.

## 2019-02-04 ENCOUNTER — Telehealth: Payer: Self-pay | Admitting: *Deleted

## 2019-02-04 NOTE — Telephone Encounter (Signed)
Returned pts call from nurse line regarding concerns about fetal movement, informed that is is normal to not fell big fetal movements at 16 weeks, that she may feel flutters and that typically around 20 weeks she can feel more movements. Pt verbalized understanding.

## 2019-02-17 ENCOUNTER — Ambulatory Visit (HOSPITAL_COMMUNITY)
Admission: RE | Admit: 2019-02-17 | Discharge: 2019-02-17 | Disposition: A | Discharge status: Home / Self Care | Payer: Medicaid Other | Source: Ambulatory Visit | Attending: Obstetrics and Gynecology | Admitting: Obstetrics and Gynecology

## 2019-02-17 ENCOUNTER — Other Ambulatory Visit: Payer: Self-pay

## 2019-02-17 ENCOUNTER — Other Ambulatory Visit: Payer: Self-pay | Admitting: Obstetrics and Gynecology

## 2019-02-17 DIAGNOSIS — Z3A18 18 weeks gestation of pregnancy: Secondary | ICD-10-CM | POA: Diagnosis not present

## 2019-02-17 DIAGNOSIS — O99012 Anemia complicating pregnancy, second trimester: Secondary | ICD-10-CM | POA: Diagnosis not present

## 2019-02-17 DIAGNOSIS — Z3480 Encounter for supervision of other normal pregnancy, unspecified trimester: Secondary | ICD-10-CM | POA: Diagnosis present

## 2019-02-17 DIAGNOSIS — Z363 Encounter for antenatal screening for malformations: Secondary | ICD-10-CM | POA: Diagnosis not present

## 2019-02-17 DIAGNOSIS — O359XX0 Maternal care for (suspected) fetal abnormality and damage, unspecified, not applicable or unspecified: Secondary | ICD-10-CM | POA: Diagnosis not present

## 2019-02-18 ENCOUNTER — Encounter: Payer: Self-pay | Admitting: Obstetrics and Gynecology

## 2019-02-18 DIAGNOSIS — Q308 Other congenital malformations of nose: Secondary | ICD-10-CM | POA: Insufficient documentation

## 2019-02-20 ENCOUNTER — Other Ambulatory Visit: Payer: Self-pay

## 2019-02-20 ENCOUNTER — Ambulatory Visit (INDEPENDENT_AMBULATORY_CARE_PROVIDER_SITE_OTHER): Payer: Medicaid Other | Admitting: Obstetrics & Gynecology

## 2019-02-20 VITALS — BP 117/76 | HR 72 | Wt 140.0 lb

## 2019-02-20 DIAGNOSIS — Z3480 Encounter for supervision of other normal pregnancy, unspecified trimester: Secondary | ICD-10-CM

## 2019-02-20 DIAGNOSIS — O9989 Other specified diseases and conditions complicating pregnancy, childbirth and the puerperium: Secondary | ICD-10-CM | POA: Diagnosis not present

## 2019-02-20 DIAGNOSIS — Z3A19 19 weeks gestation of pregnancy: Secondary | ICD-10-CM

## 2019-02-20 DIAGNOSIS — D563 Thalassemia minor: Secondary | ICD-10-CM | POA: Diagnosis not present

## 2019-02-20 MED ORDER — FLUTICASONE PROPIONATE 50 MCG/ACT NA SUSP: 1.0000 | Freq: Every day | NASAL | 2 refills | Status: DC

## 2019-02-20 NOTE — Progress Notes (Signed)
   PRENATAL VISIT NOTE  Subjective:  Sandra Palmer is a 23 y.o. G1P0000 at [redacted]w[redacted]d being seen today for ongoing prenatal care.  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  Patient reports tasting metal, had been advised to get a Covid test last week, but declined.   .  .  Movement: Present. Denies leaking of fluid.   The following portions of the patient's history were reviewed and updated as appropriate: allergies, current medications, past family history, past medical history, past social history, past surgical history and problem list.   Objective:   Vitals:   02/20/19 0846  BP: 117/76  Pulse: 72  Weight: 140 lb (63.5 kg)    Fetal Status: Fetal Heart Rate (bpm): 154   Movement: Present     General:  Alert, oriented and cooperative. Patient is in no acute distress.  Skin: Skin is warm and dry. No rash noted.   Cardiovascular: Normal heart rate noted  Respiratory: Normal respiratory effort, no problems with respiration noted  Abdomen: Soft, gravid, appropriate for gestational age.  Pain/Pressure: Absent     Pelvic: Cervical exam deferred        Extremities: Normal range of motion.  Edema: None  Mental Status: Normal mood and affect. Normal behavior. Normal judgment and thought content.   Assessment and Plan:  Pregnancy: G1P0000 at [redacted]w[redacted]d 1. Alpha thalassemia silent carrier - she declines further testing  2. Supervision of other normal pregnancy, antepartum - flonase prescribed for allergies  Preterm labor symptoms and general obstetric precautions including but not limited to vaginal bleeding, contractions, leaking of fluid and fetal movement were reviewed in detail with the patient. Please refer to After Visit Summary for other counseling recommendations.   Return  in about 4 weeks (around 03/20/2019) for virtual.  No future appointments.  Emily Filbert, MD

## 2019-02-20 NOTE — Progress Notes (Signed)
Patient never went to get covid-19 testing done after order was place on 02/03/2019. She reports her smell is better but now everything taste like salt and metal.

## 2019-03-20 ENCOUNTER — Encounter: Payer: Self-pay | Admitting: Obstetrics and Gynecology

## 2019-03-20 ENCOUNTER — Ambulatory Visit (INDEPENDENT_AMBULATORY_CARE_PROVIDER_SITE_OTHER): Payer: Medicaid Other | Admitting: Obstetrics and Gynecology

## 2019-03-20 ENCOUNTER — Other Ambulatory Visit: Payer: Self-pay

## 2019-03-20 VITALS — BP 111/76 | HR 80 | Wt 140.0 lb

## 2019-03-20 DIAGNOSIS — O3660X0 Maternal care for excessive fetal growth, unspecified trimester, not applicable or unspecified: Secondary | ICD-10-CM

## 2019-03-20 DIAGNOSIS — Z3A23 23 weeks gestation of pregnancy: Secondary | ICD-10-CM

## 2019-03-20 DIAGNOSIS — O0990 Supervision of high risk pregnancy, unspecified, unspecified trimester: Secondary | ICD-10-CM

## 2019-03-20 DIAGNOSIS — O0992 Supervision of high risk pregnancy, unspecified, second trimester: Secondary | ICD-10-CM

## 2019-03-20 DIAGNOSIS — Z3480 Encounter for supervision of other normal pregnancy, unspecified trimester: Secondary | ICD-10-CM

## 2019-03-20 NOTE — Progress Notes (Signed)
Prenatal Visit Note Date: 03/20/2019 Clinic: [facility]  Subjective:  Sandra Palmer is a 23 y.o. G1P0000 at [redacted]w[redacted]d being seen today for ongoing prenatal care.  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  Patient reports no complaints.   Contractions: Not present. Vag. Bleeding: None.  Movement: Present. Denies leaking of fluid.   The following portions of the patient's history were reviewed and updated as appropriate: allergies, current medications, past family history, past medical history, past social history, past surgical history and problem list. Problem list updated.  Objective:   Vitals:   03/20/19 0926  BP: 111/76  Pulse: 80  Weight: 140 lb (63.5 kg)    Fetal Status: Fetal Heart Rate (bpm): 166 Fundal Height: 25 cm Movement: Present     General:  Alert, oriented and cooperative. Patient is in no acute distress.  Skin: Skin is warm and dry. No rash noted.   Cardiovascular: Normal heart rate noted  Respiratory: Normal respiratory effort, no problems with respiration noted  Abdomen: Soft, gravid, appropriate for gestational age. Pain/Pressure: Absent     Pelvic:  Cervical exam deferred        Extremities: Normal range of motion.  Edema: None  Mental Status: Normal mood and affect. Normal behavior. Normal judgment and thought content.   Urinalysis:      Assessment and Plan:  Pregnancy: G1P0000 at [redacted]w[redacted]d  1. Supervision of other normal pregnancy, antepartum Recommend pregnancy pillow 28wk labs nv  2. Excessive fetal growth affecting management of pregnancy, antepartum, single or unspecified fetus - [place] MFM OB FOLLOW UP; Future  Preterm labor symptoms and general obstetric precautions including but not limited to vaginal bleeding,  contractions, leaking of fluid and fetal movement were reviewed in detail with the patient. Please refer to After Visit Summary for other counseling recommendations.  Return in about 3 weeks (around 04/10/2019) for 3-4wk in person, with 2h GTT, low risk.   Aletha Halim, MD

## 2019-04-03 ENCOUNTER — Other Ambulatory Visit: Payer: Self-pay

## 2019-04-03 ENCOUNTER — Ambulatory Visit (HOSPITAL_COMMUNITY)
Admission: RE | Admit: 2019-04-03 | Discharge: 2019-04-03 | Disposition: A | Discharge status: Home / Self Care | Payer: Medicaid Other | Source: Ambulatory Visit | Attending: Obstetrics and Gynecology | Admitting: Obstetrics and Gynecology

## 2019-04-03 DIAGNOSIS — Z362 Encounter for other antenatal screening follow-up: Secondary | ICD-10-CM | POA: Diagnosis not present

## 2019-04-03 DIAGNOSIS — O99012 Anemia complicating pregnancy, second trimester: Secondary | ICD-10-CM

## 2019-04-03 DIAGNOSIS — O3692X1 Maternal care for fetal problem, unspecified, second trimester, fetus 1: Secondary | ICD-10-CM | POA: Diagnosis not present

## 2019-04-03 DIAGNOSIS — O3660X0 Maternal care for excessive fetal growth, unspecified trimester, not applicable or unspecified: Secondary | ICD-10-CM | POA: Insufficient documentation

## 2019-04-03 DIAGNOSIS — Z3A25 25 weeks gestation of pregnancy: Secondary | ICD-10-CM | POA: Diagnosis not present

## 2019-04-03 DIAGNOSIS — O0990 Supervision of high risk pregnancy, unspecified, unspecified trimester: Secondary | ICD-10-CM | POA: Diagnosis present

## 2019-04-03 IMAGING — US US MFM OB FOLLOW-UP
1 series · 14 of 28 positions shown · non-contrast
Comparison: none

[Series 1: us mfm ob follow-up · 40 acquisitions, 14 frames shown]
[im 2/40]
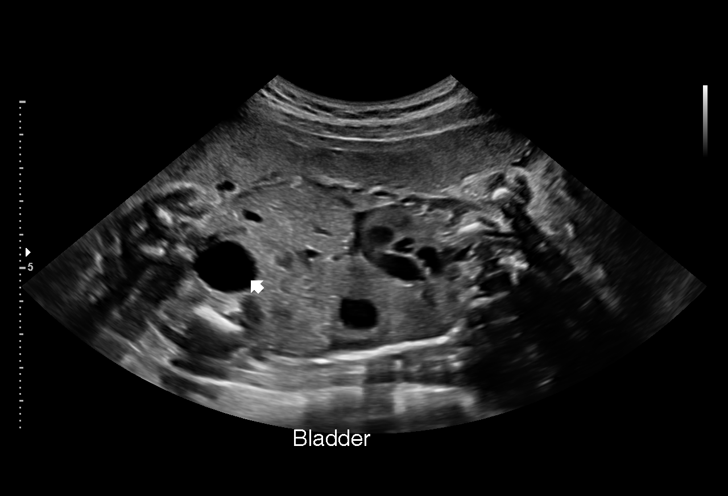
[im 5/40]
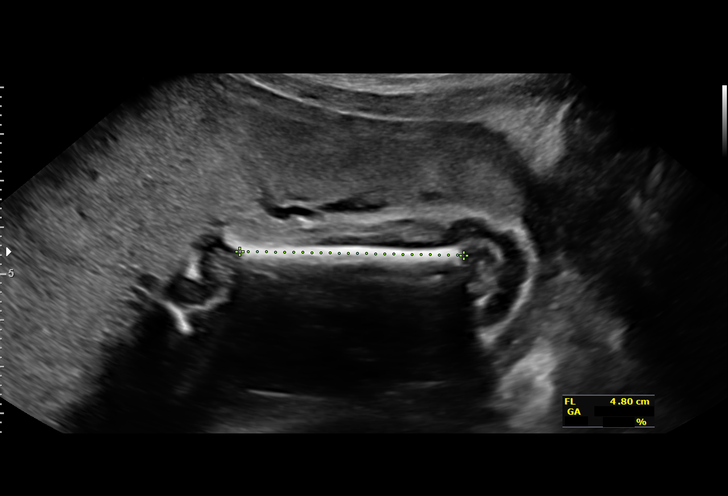
[im 8/40]
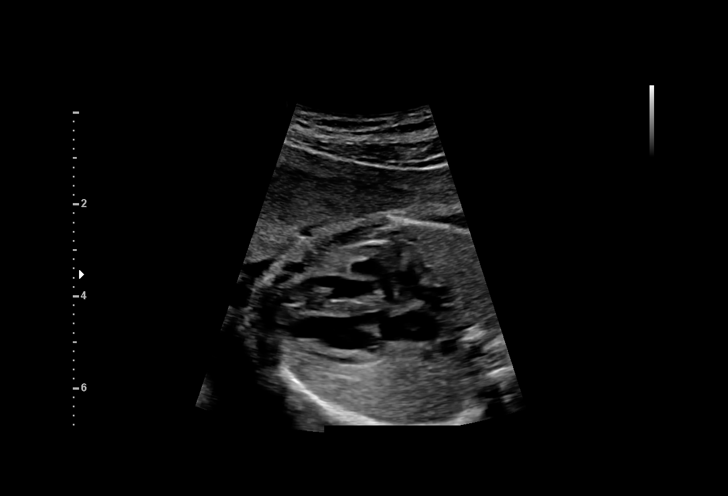
[im 11/40]
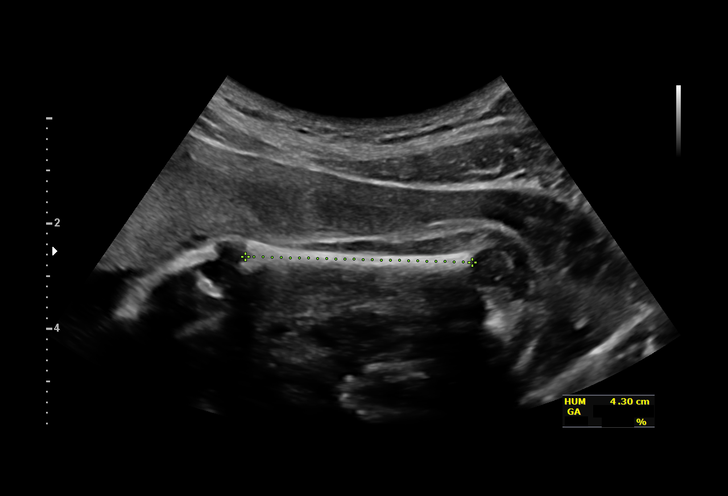
[im 14/40]
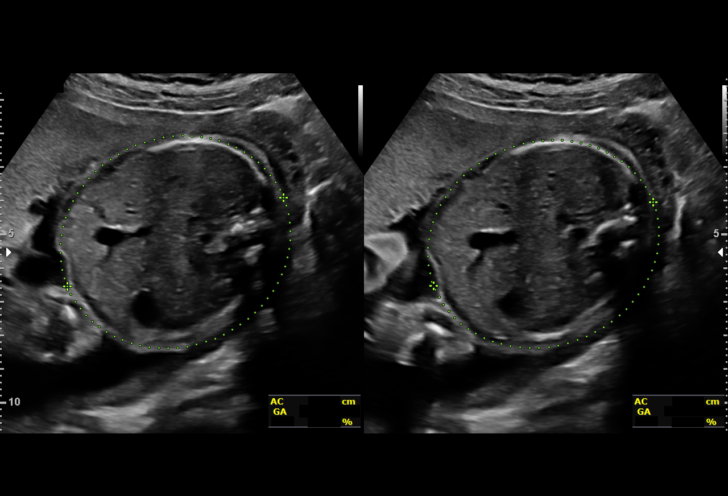
[im 16/40]
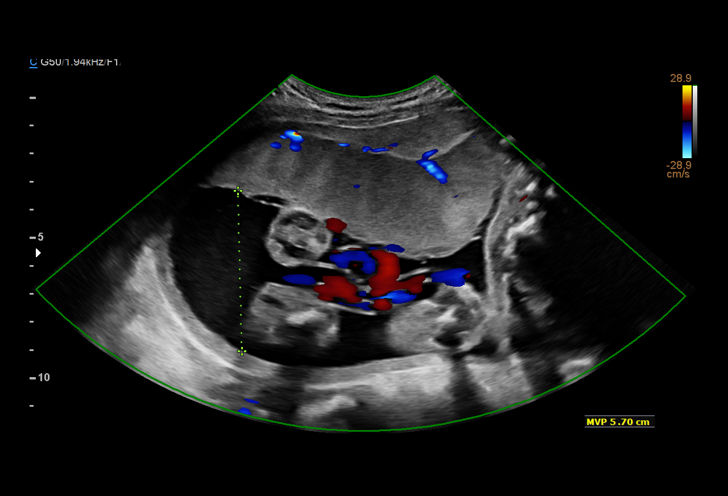
[im 19/40]
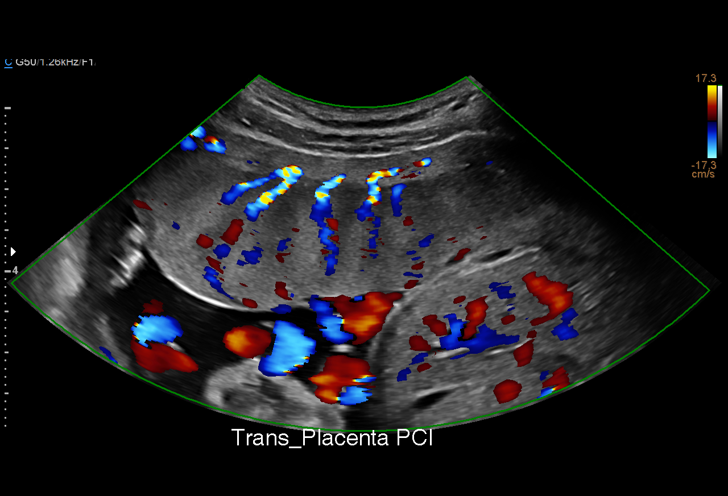
[im 22/40]
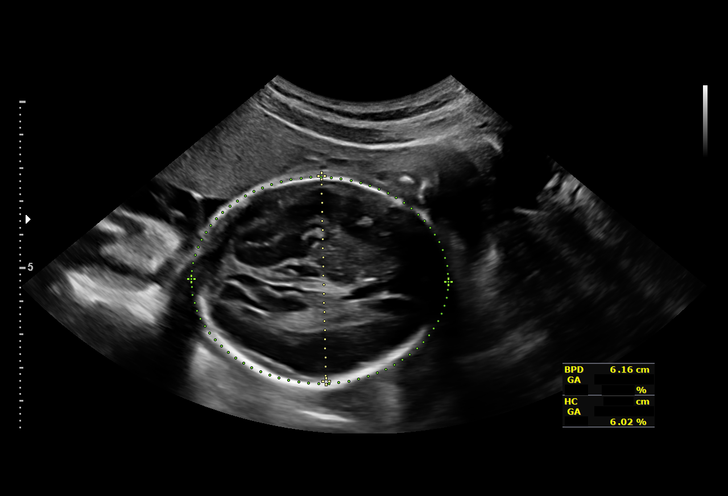
[im 25/40]
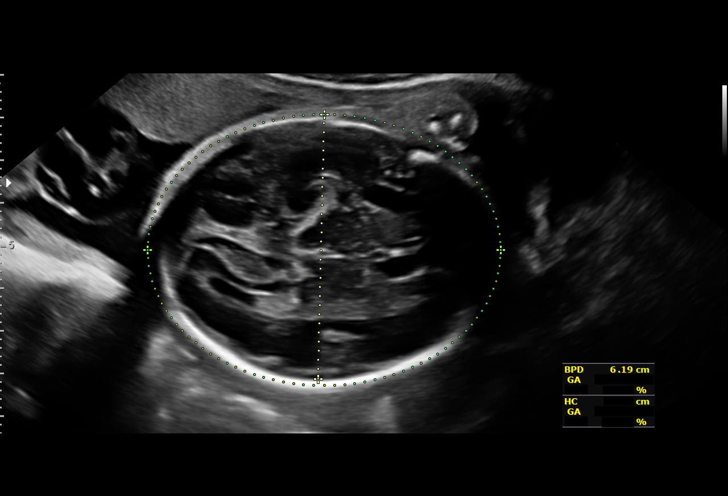
[im 28/40]
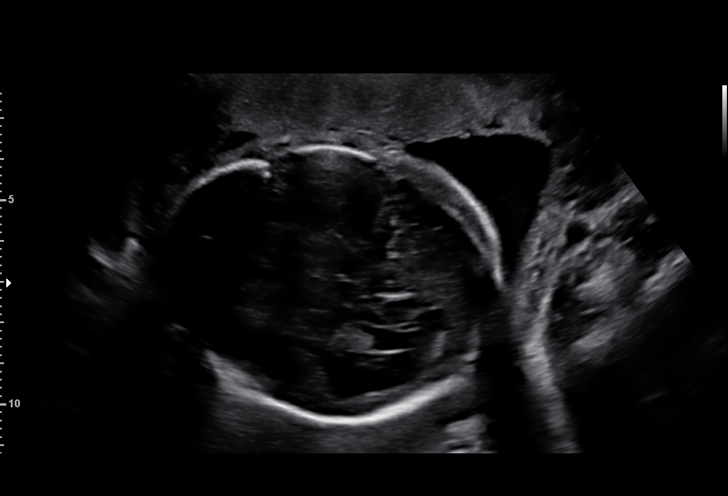
[im 31/40]
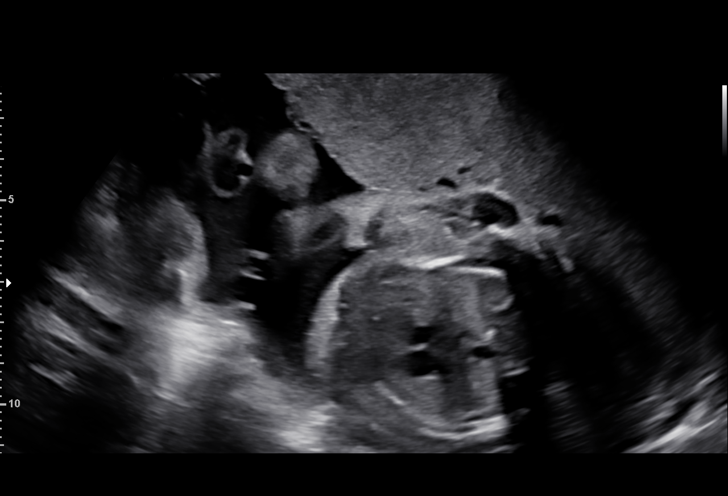
[im 34/40]
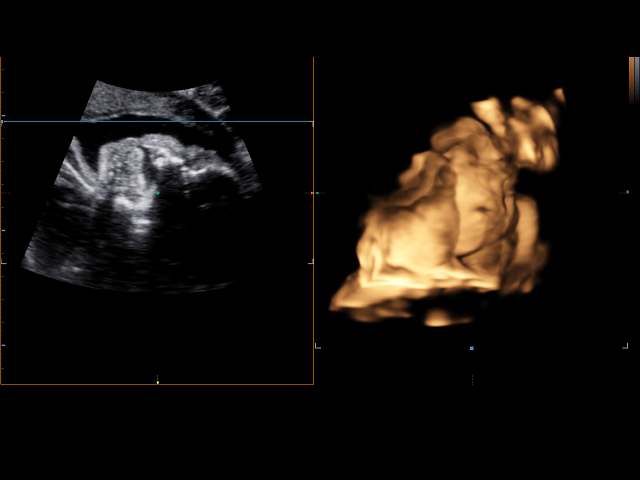
[im 37/40]
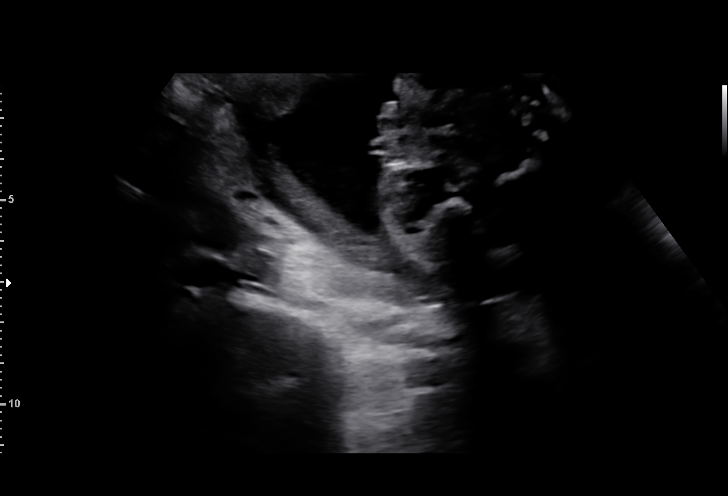
[im 40/40]
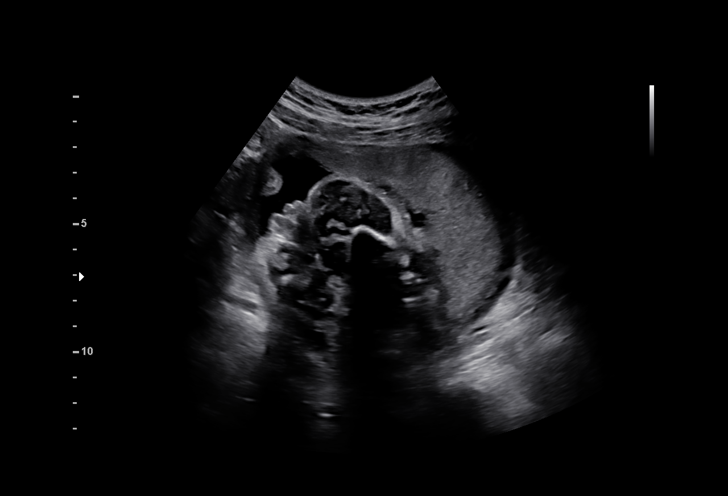

[14 of 28 positions shown; findings below may reference images not displayed]

----------------------------------------------------------------------

 ----------------------------------------------------------------------
Indications

  Encounter for other antenatal screening        [P5]
  follow-up (Low Risk NIPS)
  Fetal abnormality - other known or             [P5]
  suspected (absent NB)
  Maternal thalassemia complicating              [P5]
  pregnancy in second trimester (alpha silent
  carrier)
  25 weeks gestation of pregnancy
 ----------------------------------------------------------------------
Vital Signs

 BMI:
Fetal Evaluation

 Num Of Fetuses:         1
 Fetal Heart Rate(bpm):  155
 Cardiac Activity:       Observed
 Presentation:           Cephalic
 Placenta:               Anterior
 P. Cord Insertion:      Previously Visualized

 Amniotic Fluid
 AFI FV:      Within normal limits

                             Largest Pocket(cm)

Biometry
 BPD:      61.4  mm     G. Age:  25w 0d         40  %    CI:        72.26   %    70 - 86
                                                         FL/HC:      20.8   %    18.7 -
 HC:      229.8  mm     G. Age:  25w 0d         29  %    HC/AC:      1.11        1.04 -
 AC:       207   mm     G. Age:  25w 2d         50  %    FL/BPD:     77.7   %    71 - 87
 FL:       47.7  mm     G. Age:  26w 0d         67  %    FL/AC:      23.0   %    20 - 24
 HUM:      42.7  mm     G. Age:  25w 4d         58  %
 LV:        2.5  mm

 Est. FW:     814  gm    1 lb 13 oz      62  %
OB History

 Gravidity:    1
Gestational Age

 LMP:           25w 0d        Date:  [DATE]                 EDD:   [DATE]
 U/S Today:     25w 2d                                        EDD:   [DATE]
 Best:          25w 0d     Det. By:  LMP  ([DATE])          EDD:   [DATE]
Anatomy

 Cranium:               Appears normal         Aortic Arch:            Previously seen
 Cavum:                 Appears normal         Ductal Arch:            Previously seen
 Ventricles:            Appears normal         Diaphragm:              Appears normal
 Choroid Plexus:        Previously seen        Stomach:                Appears normal, left
                                                                       sided
 Cerebellum:            Previously seen        Abdomen:                Appears normal
 Posterior Fossa:       Previously seen        Abdominal Wall:         Previously seen
 Nuchal Fold:           Previously seen        Cord Vessels:           Previously seen
 Face:                  Absent nasal bone      Kidneys:                Previously seen
 Lips:                  Previously seen        Bladder:                Appears normal
 Thoracic:              Appears normal         Spine:                  Previously seen
 Heart:                 Appears normal         Upper Extremities:      Previously seen
                        (4CH, axis, and
                        situs)
 RVOT:                  Previously seen        Lower Extremities:      Previously seen
 LVOT:                  Previously seen

 Other:  Fetus appears to be female. Heels and RT 5th digit visualized.
         Technically difficult due to fetal position.
Comments

 This patient was seen for a follow up growth scan due to an
 absent nasal bone that was noted during her prior exam.
 She denies any problems exam.
 She was informed that the fetal growth and amniotic fluid
 level appears appropriate for her gestational age.
 The fetal nasal bone was visualized today.
 Follow-up as indicated.

## 2019-04-07 ENCOUNTER — Telehealth: Payer: Self-pay

## 2019-04-07 NOTE — Telephone Encounter (Signed)
Patient has decided to quick her job due to covid-19 she is requesting a note so that she can draw unemployment since she is high risk.

## 2019-04-10 ENCOUNTER — Ambulatory Visit (HOSPITAL_COMMUNITY): Payer: Medicaid Other

## 2019-04-14 ENCOUNTER — Other Ambulatory Visit: Payer: Self-pay

## 2019-04-14 DIAGNOSIS — Z20822 Contact with and (suspected) exposure to covid-19: Secondary | ICD-10-CM

## 2019-04-15 LAB — NOVEL CORONAVIRUS, NAA: SARS-CoV-2, NAA: NOT DETECTED

## 2019-04-17 ENCOUNTER — Encounter: Payer: Medicaid Other | Admitting: Obstetrics and Gynecology

## 2019-04-20 ENCOUNTER — Encounter (HOSPITAL_COMMUNITY): Payer: Self-pay

## 2019-04-20 ENCOUNTER — Other Ambulatory Visit: Payer: Self-pay

## 2019-04-20 ENCOUNTER — Inpatient Hospital Stay (HOSPITAL_COMMUNITY)
Admission: AD | Admit: 2019-04-20 | Discharge: 2019-04-20 | Disposition: A | Discharge status: Home / Self Care | Payer: Medicaid Other | Attending: Obstetrics and Gynecology | Admitting: Obstetrics and Gynecology

## 2019-04-20 DIAGNOSIS — M545 Low back pain: Secondary | ICD-10-CM | POA: Diagnosis present

## 2019-04-20 DIAGNOSIS — O99891 Other specified diseases and conditions complicating pregnancy: Secondary | ICD-10-CM | POA: Diagnosis not present

## 2019-04-20 DIAGNOSIS — M549 Dorsalgia, unspecified: Secondary | ICD-10-CM | POA: Diagnosis not present

## 2019-04-20 DIAGNOSIS — Z3689 Encounter for other specified antenatal screening: Secondary | ICD-10-CM

## 2019-04-20 DIAGNOSIS — Z3A27 27 weeks gestation of pregnancy: Secondary | ICD-10-CM | POA: Diagnosis not present

## 2019-04-20 DIAGNOSIS — O98812 Other maternal infectious and parasitic diseases complicating pregnancy, second trimester: Secondary | ICD-10-CM | POA: Diagnosis not present

## 2019-04-20 DIAGNOSIS — B373 Candidiasis of vulva and vagina: Secondary | ICD-10-CM | POA: Diagnosis not present

## 2019-04-20 LAB — WET PREP, GENITAL
Clue Cells Wet Prep HPF POC: NONE SEEN
Sperm: NONE SEEN
Trich, Wet Prep: NONE SEEN
Yeast Wet Prep HPF POC: NONE SEEN

## 2019-04-20 LAB — URINALYSIS, ROUTINE W REFLEX MICROSCOPIC
Bilirubin Urine: NEGATIVE
Glucose, UA: NEGATIVE mg/dL
Hgb urine dipstick: NEGATIVE
Ketones, ur: 5 mg/dL — AB
Leukocytes,Ua: NEGATIVE
Nitrite: NEGATIVE
Protein, ur: NEGATIVE mg/dL
Specific Gravity, Urine: 1.026 (ref 1.005–1.030)
pH: 6 (ref 5.0–8.0)

## 2019-04-20 MED ORDER — ACETAMINOPHEN 500 MG PO TABS
1000.0000 mg | ORAL_TABLET | Freq: Once | ORAL | Status: AC
  Administered 2019-04-20: 22:00:00 1000 mg via ORAL
  Filled 2019-04-20: qty 2

## 2019-04-20 MED ORDER — TERCONAZOLE 0.8 % VA CREA: 1.0000 | TOPICAL_CREAM | Freq: Every day | VAGINAL | 0 refills | Status: DC

## 2019-04-20 MED ORDER — CYCLOBENZAPRINE HCL 10 MG PO TABS: 10.0000 mg | ORAL_TABLET | Freq: Two times a day (BID) | ORAL | 0 refills | Status: DC | PRN

## 2019-04-20 NOTE — MAU Note (Signed)
Pt here with complaints of ongoing lower back pain. Worse today and during the night. Has been using heating pads and extra pillows, but does not help. Rates 9/10. Has not taken Tylenol. Reports good fetal movement. Denies vaginal bleeding or discharge. Denies urinary s/s.

## 2019-04-20 NOTE — Discharge Instructions (Signed)

## 2019-04-20 NOTE — MAU Provider Note (Signed)
History     CSN: [id number]  Arrival date and time: 04/20/19 2007   First Provider Initiated Contact with Patient 04/20/19 2150      Chief Complaint  Patient presents with  . Back Pain   HPI Sandra Palmer is a 23 y.o. G1P0000 at 83w3dwho presents to MAU with chief complaint of low back pain in pregnancy. This is a recurrent problem. She rates her pain as 9/10, non-radiating. She notices her pain is worse when walking, repositioning, leaning over and at night. Patient states she desires an entirely medication-free pregnancy and so has not taken medication for this complaint. She has attempted management with warm packs but has not been successful. She denies vaginal bleeding, leaking of fluid, decreased fetal movement, fever, falls, or recent illness.    OB History    Gravida  1   Para  0   Term  0   Preterm  0   AB  0   Living  0     SAB  0   TAB  0   Ectopic  0   Multiple  0   Live Births              Past Medical History:  Diagnosis Date  . Allergy   . Chlamydia 05/18/2015  . History of Papanicolaou smear of cervix 05/18/2015   Nil/Pos    Past Surgical History:  Procedure Laterality Date  . HERNIA REPAIR      History reviewed. No pertinent family history.  Social History   Tobacco Use  . Smoking status: Never Smoker  . Smokeless tobacco: Never Used  Substance Use Topics  . Alcohol use: Not Currently  . Drug use: No    Allergies:  Allergies  Allergen Reactions  . Azithromycin Swelling and Rash    Medications Prior to Admission  Medication Sig Dispense Refill Last Dose  . Blood Pressure Monitor KIT 1 each by Does not apply route daily. 1 each 0 04/19/2019 at Unknown time  . fluticasone (FLONASE) 50 MCG/ACT nasal spray Place 1 spray into both nostrils daily. 1 g 2 04/19/2019 at Unknown time  . Prenatal Vit-Fe Fumarate-FA (PREPLUS) 27-1 MG TABS Take 1 tablet by mouth daily. 30 tablet 13 04/20/2019 at Unknown time  . benzonatate (TESSALON)  100 MG capsule Take 1 capsule (100 mg total) by mouth 3 (three) times daily as needed for cough. 20 capsule 0   . Multiple Vitamins-Calcium (ONE-A-DAY WOMENS PO) Take by mouth.     . Prenatal MV-Min-FA-Omega-3 (PRENATAL GUMMIES/DHA & FA) 0.4-32.5 MG CHEW Chew by mouth.       Review of Systems  Constitutional: Negative for chills, fatigue and fever.  Gastrointestinal: Negative for abdominal pain.  Genitourinary: Negative for difficulty urinating, dysuria, vaginal bleeding and vaginal discharge.  Musculoskeletal: Positive for back pain.  All other systems reviewed and are negative.  Physical Exam   Blood pressure 121/75, pulse 64, temperature 98.1 F (36.7 C), temperature source Oral, resp. rate 16, height '5\' 7"'  (1.702 m), weight 65.9 kg, last menstrual period 10/10/2018, SpO2 100 %.  Physical Exam  Nursing note and vitals reviewed. Constitutional: She is oriented to person, place, and time. She appears well-developed and well-nourished.  Cardiovascular: Normal rate.  Respiratory: Effort normal.  GI: There is no abdominal tenderness. There is no CVA tenderness.  Gravid  Genitourinary:    Vaginal discharge present.     Genitourinary Comments: Thick white clusters of vaginal discharge throughout vault   Neurological: She  is alert and oriented to person, place, and time.  Skin: Skin is warm and dry.  Psychiatric: She has a normal mood and affect. Her behavior is normal. Judgment and thought content normal.    MAU Course/MDM  Procedures: sterile speculum exam  --Cervix visually closed on SSE, confirmed with digital exam --Reactive tracing: baseline 150, moderate variability, positive 10 x 10 accels --Toco: quiet --Patient does not have ride home. Discussed that she is a good candidate for Flexeril but it is likely to make her sleepy. Unable to give in MAU due to lack of ride home, pt agreeable to outpatient rx  Patient Vitals for the past 24 hrs:  BP Temp Temp src Pulse Resp SpO2  Height Weight  04/20/19 2235 110/62 98.6 F (37 C) Oral 61 18 100 % - -  04/20/19 2107 121/75 - - 64 - - - -  04/20/19 2041 117/71 98.1 F (36.7 C) Oral 63 16 100 % '5\' 7"'  (1.702 m) 65.9 kg   Results for orders placed or performed during the hospital encounter of 04/20/19 (from the past 24 hour(s))  Urinalysis, Routine w reflex microscopic     Status: Abnormal   Collection Time: 04/20/19  9:00 PM  Result Value Ref Range   Color, Urine YELLOW YELLOW   APPearance HAZY (A) CLEAR   Specific Gravity, Urine 1.026 1.005 - 1.030   pH 6.0 5.0 - 8.0   Glucose, UA NEGATIVE NEGATIVE mg/dL   Hgb urine dipstick NEGATIVE NEGATIVE   Bilirubin Urine NEGATIVE NEGATIVE   Ketones, ur 5 (A) NEGATIVE mg/dL   Protein, ur NEGATIVE NEGATIVE mg/dL   Nitrite NEGATIVE NEGATIVE   Leukocytes,Ua NEGATIVE NEGATIVE  Wet prep, genital     Status: Abnormal   Collection Time: 04/20/19  9:57 PM  Result Value Ref Range   Yeast Wet Prep HPF POC NONE SEEN NONE SEEN   Trich, Wet Prep NONE SEEN NONE SEEN   Clue Cells Wet Prep HPF POC NONE SEEN NONE SEEN   WBC, Wet Prep HPF POC MANY (A) NONE SEEN   Sperm NONE SEEN    Meds ordered this encounter  Medications  . acetaminophen (TYLENOL) tablet 1,000 mg  . cyclobenzaprine (FLEXERIL) 10 MG tablet    Sig: Take 1 tablet (10 mg total) by mouth 2 (two) times daily as needed for muscle spasms.    Dispense:  20 tablet    Refill:  0    Order Specific Question:   Supervising Provider    Answer:   Aletha Halim [id number]  . terconazole (TERAZOL 3) 0.8 % vaginal cream    Sig: Place 1 applicator vaginally at bedtime. Apply nightly for three nights.    Dispense:  20 g    Refill:  0    Order Specific Question:   Supervising Provider    Answer:   Aletha Halim [[id number]]   Assessment and Plan  --23 y.o. G1P0000 at 109w3d --Reactive tracing --Pain reduced from 9/10 to 6/10 with Tylenol --Home rx Flexeril --Vulvovaginal candidiasis, treat based on physical exam --Discussed  round ligament pain as typical complaint in late second/early third trimester.  Interventions for same --Discharge home in stable condition  [facility]: [facility] [place]  SDarlina Rumpf CNorth [place]10/10/2018, 11:02 PM

## 2019-04-22 LAB — GC/CHLAMYDIA PROBE AMP (~~LOC~~) NOT AT ARMC
Chlamydia: NEGATIVE
Neisseria Gonorrhea: NEGATIVE

## 2019-04-24 ENCOUNTER — Other Ambulatory Visit: Payer: Self-pay

## 2019-04-24 ENCOUNTER — Ambulatory Visit (INDEPENDENT_AMBULATORY_CARE_PROVIDER_SITE_OTHER): Payer: Medicaid Other | Admitting: Obstetrics & Gynecology

## 2019-04-24 VITALS — BP 114/77 | HR 73 | Wt 148.0 lb

## 2019-04-24 DIAGNOSIS — Z23 Encounter for immunization: Secondary | ICD-10-CM

## 2019-04-24 DIAGNOSIS — Z3480 Encounter for supervision of other normal pregnancy, unspecified trimester: Secondary | ICD-10-CM

## 2019-04-24 DIAGNOSIS — O365931 Maternal care for other known or suspected poor fetal growth, third trimester, fetus 1: Secondary | ICD-10-CM

## 2019-04-24 DIAGNOSIS — Z3A28 28 weeks gestation of pregnancy: Secondary | ICD-10-CM

## 2019-04-24 MED ORDER — TETANUS-DIPHTH-ACELL PERTUSSIS 5-2.5-18.5 LF-MCG/0.5 IM SUSP: 0.5000 mL | Freq: Once | INTRAMUSCULAR | Status: DC

## 2019-04-24 NOTE — Progress Notes (Signed)
PRENATAL VISIT NOTE  Subjective:  Sandra Palmer is a 23 y.o. G1P0000 at [redacted]w[redacted]d being seen today for ongoing prenatal care.  She is currently monitored for the following issues for this low-risk pregnancy and has Allergic rhinitis; Supervision of other normal pregnancy, antepartum; and Alpha thalassemia silent carrier on their problem list.  Patient reports no complaints.  Contractions: Not present. Vag. Bleeding: None.  Movement: Present. Denies leaking of fluid.   The following portions of the patient's history were reviewed and updated as appropriate: allergies, current medications, past family history, past medical history, past social history, past surgical history and problem list.   Objective:   Vitals:   04/24/19 0956  BP: 114/77  Pulse: 73  Weight: 148 lb (67.1 kg)    Fetal Status: Fetal Heart Rate (bpm): 166 Fundal Height: 24 cm Movement: Present     General:  Alert, oriented and cooperative. Patient is in no acute distress.  Skin: Skin is warm and dry. No rash noted.   Cardiovascular: Normal heart rate noted  Respiratory: Normal respiratory effort, no problems with respiration noted  Abdomen: Soft, gravid, appropriate for gestational age.  Pain/Pressure: Absent     Pelvic: Cervical exam deferred        Extremities: Normal range of motion.  Edema: None  Mental Status: Normal mood and affect. Normal behavior. Normal judgment and thought content.   Imaging: [place] Mfm Ob Follow Up  Result Date: 04/03/2019 ----------------------------------------------------------------------  OBSTETRICS REPORT                       (Signed Final 04/03/2019 04:58 pm) ---------------------------------------------------------------------- Patient Info  ID #:       [id number]                          D.O.B.:  January 29, 1996 (23 yrs)  Name:       Sandra Palmer Glas                   Visit Date: 04/03/2019 09:34 am ---------------------------------------------------------------------- Performed By  Performed By:      Georgie Chard        Ref. Address:     [address]                                                             [place], [place]  Attending:        Christy Sartorius  Parke Poisson MD         [facility]  Referred By:      Lupus Bing MD ---------------------------------------------------------------------- Orders   #  Description                          Code         Ordered By   1  [place] MFM OB FOLLOW UP                  78295.62     [place] Bing  ----------------------------------------------------------------------   #  Order #                    Accession #                 Episode #   1  [id number]                  [id number]                  [id number]  ---------------------------------------------------------------------- Indications   Encounter for other antenatal screening        Z36.2   follow-up (Low Risk NIPS)   Fetal abnormality - other known or             O35.9XX0   suspected (absent NB)   Maternal thalassemia complicating              O99.012   pregnancy in second trimester (alpha silent   carrier)   [redacted] weeks gestation of pregnancy                Z3A.25  ---------------------------------------------------------------------- Vital Signs  Weight (lb): 140                               Height:        5'7"  BMI:         21.92 ---------------------------------------------------------------------- Fetal Evaluation  Num Of Fetuses:         1  Fetal Heart Rate(bpm):  155  Cardiac Activity:       Observed  Presentation:           Cephalic  Placenta:               Anterior  P. Cord Insertion:      Previously Visualized  Amniotic Fluid  AFI FV:      Within normal limits                              Largest Pocket(cm)                              5.7  ---------------------------------------------------------------------- Biometry  BPD:      61.4  mm  G. Age:  25w 0d         40  %    CI:        72.26   %    70 - 86                                                          FL/HC:      20.8   %    18.7 - 20.3  HC:      229.8  mm     G. Age:  25w 0d         29  %    HC/AC:      1.11        1.04 - 1.22  AC:       207   mm     G. Age:  25w 2d         50  %    FL/BPD:     77.7   %    71 - 87  FL:       47.7  mm     G. Age:  26w 0d         67  %    FL/AC:      23.0   %    20 - 24  HUM:      42.7  mm     G. Age:  25w 4d         58  %  LV:        2.5  mm  Est. FW:     814  gm    1 lb 13 oz      62  % ---------------------------------------------------------------------- OB History  Gravidity:    1 ---------------------------------------------------------------------- Gestational Age  LMP:           25w 0d        Date:  10/10/18                 EDD:   07/17/19  U/S Today:     25w 2d                                        EDD:   07/15/19  Best:          25w 0d     Det. By:  LMP  (10/10/18)          EDD:   07/17/19 ---------------------------------------------------------------------- Anatomy  Cranium:               Appears normal         Aortic Arch:            Previously seen  Cavum:                 Appears normal         Ductal Arch:            Previously seen  Ventricles:            Appears normal         Diaphragm:              Appears normal  Choroid Plexus:  Previously seen        Stomach:                Appears normal, left                                                                        sided  Cerebellum:            Previously seen        Abdomen:                Appears normal  Posterior Fossa:       Previously seen        Abdominal Wall:         Previously seen  Nuchal Fold:           Previously seen        Cord Vessels:           Previously seen  Face:                  Absent nasal bone      Kidneys:                Previously seen  Lips:                   Previously seen        Bladder:                Appears normal  Thoracic:              Appears normal         Spine:                  Previously seen  Heart:                 Appears normal         Upper Extremities:      Previously seen                         (4CH, axis, and                         situs)  RVOT:                  Previously seen        Lower Extremities:      Previously seen  LVOT:                  Previously seen  Other:  Fetus appears to be female. Heels and RT 5th digit visualized.          Technically difficult due to fetal position. ---------------------------------------------------------------------- Comments  This patient was seen for a follow up growth scan due to an  absent nasal bone that was noted during her prior exam.  She denies any problems exam.  She was informed that the fetal growth and amniotic fluid  level appears appropriate for her gestational age.  The fetal nasal bone was visualized today.  Follow-up as indicated. ----------------------------------------------------------------------  Ma RingsVictor Fang, MD Electronically Signed Final Report   04/03/2019 04:58 pm ----------------------------------------------------------------------   Assessment and Plan:  Pregnancy: G1P0000 at 634w0d 1. Small for dates affecting management of mother, third trimester, fetus 1 Follow up growth scan ordered.  - US MFM OB FOLLOW UP; Future  2. Supervision of other normal pregnancy, antepartum Third trimester labs, Tdap today. - Glucose Tolerance, 2 Hours w/1 Hour - CBC - RPR - HIV Antibody (routine testing w rflx) - Tdap (BOOSTRIX) injection 0.5 mL Preterm labor symptoms and general obstetric precautions including but not limited to vaginal bleeding, contractions, leaking of fluid and fetal movement were reviewed in detail with the patient. Please refer to After Visit Summary for other counseling recommendations.   Return in about 2 weeks (around 05/08/2019) for  OFFICE OB Visit (after MFM scan).  No future appointments.  Jaynie CollinsUgonna , MD

## 2019-04-24 NOTE — Patient Instructions (Signed)
Return to office for any scheduled appointments. Call the office or go to the MAU at [facility] if:  You begin to have strong, frequent contractions  Your water breaks.  Sometimes it is a big gush of fluid, sometimes it is just a trickle that keeps getting your panties wet or running down your legs  You have vaginal bleeding.  It is normal to have a small amount of spotting if your cervix was checked.   You do not feel your baby moving like normal.  If you do not, get something to eat and drink and lay down and focus on feeling your baby move.   If your baby is still not moving like normal, you should call the office or go to MAU.  Any other obstetric concerns.   Third Trimester of Pregnancy The third trimester is from week 28 through week 40 (months 7 through 9). The third trimester is a time when the unborn baby (fetus) is growing rapidly. At the end of the ninth month, the fetus is about 20 inches in length and weighs 6-10 pounds. Body changes during your third trimester Your body will continue to go through many changes during pregnancy. The changes vary from woman to woman. During the third trimester:  Your weight will continue to increase. You can expect to gain 25-35 pounds (11-16 kg) by the end of the pregnancy.  You may begin to get stretch marks on your hips, abdomen, and breasts.  You may urinate more often because the fetus is moving lower into your pelvis and pressing on your bladder.  You may develop or continue to have heartburn. This is caused by increased hormones that slow down muscles in the digestive tract.  You may develop or continue to have constipation because increased hormones slow digestion and cause the muscles that push waste through your intestines to relax.  You may develop hemorrhoids. These are swollen veins (varicose veins) in the rectum that can itch or be painful.  You may develop swollen, bulging veins (varicose veins)  in your legs.  You may have increased body aches in the pelvis, back, or thighs. This is due to weight gain and increased hormones that are relaxing your joints.  You may have changes in your hair. These can include thickening of your hair, rapid growth, and changes in texture. Some women also have hair loss during or after pregnancy, or hair that feels dry or thin. Your hair will most likely return to normal after your baby is born.  Your breasts will continue to grow and they will continue to become tender. A yellow fluid (colostrum) may leak from your breasts. This is the first milk you are producing for your baby.  Your belly button may stick out.  You may notice more swelling in your hands, face, or ankles.  You may have increased tingling or numbness in your hands, arms, and legs. The skin on your belly may also feel numb.  You may feel short of breath because of your expanding uterus.  You may have more problems sleeping. This can be caused by the size of your belly, increased need to urinate, and an increase in your body's metabolism.  You may notice the fetus "dropping," or moving lower in your abdomen (lightening).  You may have increased vaginal discharge.  You may notice your joints feel loose and you may have pain around your pelvic bone. What to expect at prenatal visits You will have   prenatal exams every 2 weeks until week 36. Then you will have weekly prenatal exams. During a routine prenatal visit:  You will be weighed to make sure you and the baby are growing normally.  Your blood pressure will be taken.  Your abdomen will be measured to track your baby's growth.  The fetal heartbeat will be listened to.  Any test results from the previous visit will be discussed.  You may have a cervical check near your due date to see if your cervix has softened or thinned (effaced).  You will be tested for Group B streptococcus. This happens between 35 and 37 weeks. Your  health care provider may ask you:  What your birth plan is.  How you are feeling.  If you are feeling the baby move.  If you have had any abnormal symptoms, such as leaking fluid, bleeding, severe headaches, or abdominal cramping.  If you are using any tobacco products, including cigarettes, chewing tobacco, and electronic cigarettes.  If you have any questions. Other tests or screenings that may be performed during your third trimester include:  Blood tests that check for low iron levels (anemia).  Fetal testing to check the health, activity level, and growth of the fetus. Testing is done if you have certain medical conditions or if there are problems during the pregnancy.  Nonstress test (NST). This test checks the health of your baby to make sure there are no signs of problems, such as the baby not getting enough oxygen. During this test, a belt is placed around your belly. The baby is made to move, and its heart rate is monitored during movement. What is false labor? False labor is a condition in which you feel small, irregular tightenings of the muscles in the womb (contractions) that usually go away with rest, changing position, or drinking water. These are called Braxton Feliciano contractions. Contractions may last for hours, days, or even weeks before true labor sets in. If contractions come at regular intervals, become more frequent, increase in intensity, or become painful, you should see your health care provider. What are the signs of labor?  Abdominal cramps.  Regular contractions that start at 10 minutes apart and become stronger and more frequent with time.  Contractions that start on the top of the uterus and spread down to the lower abdomen and back.  Increased pelvic pressure and dull back pain.  A watery or bloody mucus discharge that comes from the vagina.  Leaking of amniotic fluid. This is also known as your "water breaking." It could be a slow trickle or a gush.  Let your health care provider know if it has a color or strange odor. If you have any of these signs, call your health care provider right away, even if it is before your due date. Follow these instructions at home: Medicines  Follow your health care provider's instructions regarding medicine use. Specific medicines may be either safe or unsafe to take during pregnancy.  Take a prenatal vitamin that contains at least 600 micrograms (mcg) of folic acid.  If you develop constipation, try taking a stool softener if your health care provider approves. Eating and drinking   Eat a balanced diet that includes fresh fruits and vegetables, whole grains, good sources of protein such as meat, eggs, or tofu, and low-fat dairy. Your health care provider will help you determine the amount of weight gain that is right for you.  Avoid raw meat and uncooked cheese. These carry germs that   can cause birth defects in the baby.  If you have low calcium intake from food, talk to your health care provider about whether you should take a daily calcium supplement.  Eat four or five small meals rather than three large meals a day.  Limit foods that are high in fat and processed sugars, such as fried and sweet foods.  To prevent constipation: ? Drink enough fluid to keep your urine clear or pale yellow. ? Eat foods that are high in fiber, such as fresh fruits and vegetables, whole grains, and beans. Activity  Exercise only as directed by your health care provider. Most women can continue their usual exercise routine during pregnancy. Try to exercise for 30 minutes at least 5 days a week. Stop exercising if you experience uterine contractions.  Avoid heavy lifting.  Do not exercise in extreme heat or humidity, or at high altitudes.  Wear low-heel, comfortable shoes.  Practice good posture.  You may continue to have sex unless your health care provider tells you otherwise. Relieving pain and  discomfort  Take frequent breaks and rest with your legs elevated if you have leg cramps or low back pain.  Take warm sitz baths to soothe any pain or discomfort caused by hemorrhoids. Use hemorrhoid cream if your health care provider approves.  Wear a good support bra to prevent discomfort from breast tenderness.  If you develop varicose veins: ? Wear support pantyhose or compression stockings as told by your healthcare provider. ? Elevate your feet for 15 minutes, 3-4 times a day. Prenatal care  Write down your questions. Take them to your prenatal visits.  Keep all your prenatal visits as told by your health care provider. This is important. Safety  Wear your seat belt at all times when driving.  Make a list of emergency phone numbers, including numbers for family, friends, the hospital, and police and fire departments. General instructions  Avoid cat litter boxes and soil used by cats. These carry germs that can cause birth defects in the baby. If you have a cat, ask someone to clean the litter box for you.  Do not travel far distances unless it is absolutely necessary and only with the approval of your health care provider.  Do not use hot tubs, steam rooms, or saunas.  Do not drink alcohol.  Do not use any products that contain nicotine or tobacco, such as cigarettes and e-cigarettes. If you need help quitting, ask your health care provider.  Do not use any medicinal herbs or unprescribed drugs. These chemicals affect the formation and growth of the baby.  Do not douche or use tampons or scented sanitary pads.  Do not cross your legs for long periods of time.  To prepare for the arrival of your baby: ? Take prenatal classes to understand, practice, and ask questions about labor and delivery. ? Make a trial run to the hospital. ? Visit the hospital and tour the maternity area. ? Arrange for maternity or paternity leave through employers. ? Arrange for family and  friends to take care of pets while you are in the hospital. ? Purchase a rear-facing car seat and make sure you know how to install it in your car. ? Pack your hospital bag. ? Prepare the baby's nursery. Make sure to remove all pillows and stuffed animals from the baby's crib to prevent suffocation.  Visit your dentist if you have not gone during your pregnancy. Use a soft toothbrush to brush your teeth and be   gentle when you floss. Contact a health care provider if:  You are unsure if you are in labor or if your water has broken.  You become dizzy.  You have mild pelvic cramps, pelvic pressure, or nagging pain in your abdominal area.  You have lower back pain.  You have persistent nausea, vomiting, or diarrhea.  You have an unusual or bad smelling vaginal discharge.  You have pain when you urinate. Get help right away if:  Your water breaks before 37 weeks.  You have regular contractions less than 5 minutes apart before 37 weeks.  You have a fever.  You are leaking fluid from your vagina.  You have spotting or bleeding from your vagina.  You have severe abdominal pain or cramping.  You have rapid weight loss or weight gain.  You have shortness of breath with chest pain.  You notice sudden or extreme swelling of your face, hands, ankles, feet, or legs.  Your baby makes fewer than 10 movements in 2 hours.  You have severe headaches that do not go away when you take medicine.  You have vision changes. Summary  The third trimester is from week 28 through week 40, months 7 through 9. The third trimester is a time when the unborn baby (fetus) is growing rapidly.  During the third trimester, your discomfort may increase as you and your baby continue to gain weight. You may have abdominal, leg, and back pain, sleeping problems, and an increased need to urinate.  During the third trimester your breasts will keep growing and they will continue to become tender. A yellow  fluid (colostrum) may leak from your breasts. This is the first milk you are producing for your baby.  False labor is a condition in which you feel small, irregular tightenings of the muscles in the womb (contractions) that eventually go away. These are called Braxton Vanderloop contractions. Contractions may last for hours, days, or even weeks before true labor sets in.  Signs of labor can include: abdominal cramps; regular contractions that start at 10 minutes apart and become stronger and more frequent with time; watery or bloody mucus discharge that comes from the vagina; increased pelvic pressure and dull back pain; and leaking of amniotic fluid. This information is not intended to replace advice given to you by your health care provider. Make sure you discuss any questions you have with your health care provider. Document Released: 06/27/2001 Document Revised: 10/24/2018 Document Reviewed: 08/08/2016 Elsevier Patient Education  2020 [employer]    

## 2019-04-25 ENCOUNTER — Other Ambulatory Visit: Payer: Self-pay | Admitting: Obstetrics & Gynecology

## 2019-04-25 LAB — CBC
Hematocrit: 44.1 % (ref 34.0–46.6)
Hemoglobin: 14.4 g/dL (ref 11.1–15.9)
MCH: 29 pg (ref 26.6–33.0)
MCHC: 32.7 g/dL (ref 31.5–35.7)
MCV: 89 fL (ref 79–97)
Platelets: 244 10*3/uL (ref 150–450)
RBC: 4.97 x10E6/uL (ref 3.77–5.28)
RDW: 13.4 % (ref 11.7–15.4)
WBC: 10.5 10*3/uL (ref 3.4–10.8)

## 2019-04-25 LAB — GLUCOSE TOLERANCE, 2 HOURS W/ 1HR
Glucose, 1 hour: 136 mg/dL (ref 65–179)
Glucose, 2 hour: 88 mg/dL (ref 65–152)
Glucose, Fasting: 72 mg/dL (ref 65–91)

## 2019-04-25 LAB — RPR: RPR Ser Ql: NONREACTIVE

## 2019-04-25 LAB — HIV ANTIBODY (ROUTINE TESTING W REFLEX): HIV Screen 4th Generation wRfx: NONREACTIVE

## 2019-05-01 ENCOUNTER — Other Ambulatory Visit: Payer: Self-pay

## 2019-05-01 ENCOUNTER — Ambulatory Visit (HOSPITAL_COMMUNITY)
Admission: RE | Admit: 2019-05-01 | Discharge: 2019-05-01 | Disposition: A | Discharge status: Home / Self Care | Payer: Medicaid Other | Source: Ambulatory Visit | Attending: Obstetrics & Gynecology | Admitting: Obstetrics & Gynecology

## 2019-05-01 DIAGNOSIS — Z362 Encounter for other antenatal screening follow-up: Secondary | ICD-10-CM | POA: Diagnosis not present

## 2019-05-01 DIAGNOSIS — Z148 Genetic carrier of other disease: Secondary | ICD-10-CM | POA: Diagnosis not present

## 2019-05-01 DIAGNOSIS — Z3A29 29 weeks gestation of pregnancy: Secondary | ICD-10-CM

## 2019-05-01 DIAGNOSIS — O365931 Maternal care for other known or suspected poor fetal growth, third trimester, fetus 1: Secondary | ICD-10-CM | POA: Diagnosis present

## 2019-05-01 DIAGNOSIS — O26843 Uterine size-date discrepancy, third trimester: Secondary | ICD-10-CM | POA: Diagnosis not present

## 2019-05-08 ENCOUNTER — Encounter: Payer: Medicaid Other | Admitting: Obstetrics and Gynecology

## 2019-05-08 ENCOUNTER — Ambulatory Visit (INDEPENDENT_AMBULATORY_CARE_PROVIDER_SITE_OTHER): Payer: Medicaid Other | Admitting: Obstetrics and Gynecology

## 2019-05-08 ENCOUNTER — Other Ambulatory Visit: Payer: Self-pay

## 2019-05-08 VITALS — BP 124/85 | HR 71 | Wt 148.0 lb

## 2019-05-08 DIAGNOSIS — Z3A30 30 weeks gestation of pregnancy: Secondary | ICD-10-CM

## 2019-05-08 DIAGNOSIS — Z3483 Encounter for supervision of other normal pregnancy, third trimester: Secondary | ICD-10-CM

## 2019-05-08 DIAGNOSIS — R21 Rash and other nonspecific skin eruption: Secondary | ICD-10-CM

## 2019-05-08 DIAGNOSIS — Z3480 Encounter for supervision of other normal pregnancy, unspecified trimester: Secondary | ICD-10-CM

## 2019-05-08 NOTE — Progress Notes (Signed)
Prenatal Visit Note Date: 05/08/2019 Clinic: [facility]  Subjective:  Sandra Palmer is a 23 y.o. G1P0000 at [redacted]w[redacted]d being seen today for ongoing prenatal care.  She is currently monitored for the following issues for this low-risk pregnancy and has Allergic rhinitis; Supervision of other normal pregnancy, antepartum; Alpha thalassemia silent carrier; and Rash and nonspecific skin eruption on their problem list.  Patient reports rash on jaw and sternum area starting last week. no other s/s, no prior history.   Contractions: Not present. Vag. Bleeding: None.  Movement: Present. Denies leaking of fluid.   The following portions of the patient's history were reviewed and updated as appropriate: allergies, current medications, past family history, past medical history, past social history, past surgical history and problem list. Problem list updated.  Objective:   Vitals:   05/08/19 1033  BP: 124/85  Pulse: 71  Weight: 148 lb (67.1 kg)    Fetal Status: Fetal Heart Rate (bpm): 145   Movement: Present  Presentation: Transverse  General:  Alert, oriented and cooperative. Patient is in no acute distress.  Skin: Skin is warm and dry. On chin and sternum is slight rash that is nttp slightly erythematous and flat to slightly raised   Cardiovascular: Normal heart rate noted  Respiratory: Normal respiratory effort, no problems with respiration noted  Abdomen: Soft, gravid, appropriate for gestational age. Pain/Pressure: Absent     Pelvic:  Cervical exam deferred        Extremities: Normal range of motion.  Edema: None  Mental Status: Normal mood and affect. Normal behavior. Normal judgment and thought content.   Urinalysis:      Assessment and Plan:  Pregnancy: G1P0000 at [redacted]w[redacted]d  1. Supervision of other normal pregnancy, antepartum Routine care. Normal growth last week  2. Rash and nonspecific skin eruption Likely pregnancy related. Recommend keeping it moist  and let us know if ever develops any s/s.  Preterm labor symptoms and general obstetric precautions including but not limited to vaginal bleeding, contractions, leaking of fluid and fetal movement were reviewed in detail with the patient. Please refer to After Visit Summary for other counseling recommendations.  Return in about 2 weeks (around 05/22/2019) for 2-3wk rob in person.   Aletha Halim, MD

## 2019-05-08 NOTE — Progress Notes (Signed)
Rash on face and between breast

## 2019-05-26 ENCOUNTER — Encounter (HOSPITAL_COMMUNITY): Payer: Self-pay

## 2019-05-26 ENCOUNTER — Other Ambulatory Visit: Payer: Self-pay

## 2019-05-26 ENCOUNTER — Inpatient Hospital Stay (HOSPITAL_COMMUNITY)
Admission: AD | Admit: 2019-05-26 | Discharge: 2019-05-29 | DRG: 807 | Disposition: A | Discharge status: Home / Self Care | Payer: Medicaid Other | Attending: Obstetrics and Gynecology | Admitting: Obstetrics and Gynecology

## 2019-05-26 DIAGNOSIS — D563 Thalassemia minor: Secondary | ICD-10-CM | POA: Diagnosis present

## 2019-05-26 DIAGNOSIS — Z20828 Contact with and (suspected) exposure to other viral communicable diseases: Secondary | ICD-10-CM | POA: Diagnosis present

## 2019-05-26 DIAGNOSIS — Z3A32 32 weeks gestation of pregnancy: Secondary | ICD-10-CM | POA: Diagnosis not present

## 2019-05-26 DIAGNOSIS — O134 Gestational [pregnancy-induced] hypertension without significant proteinuria, complicating childbirth: Principal | ICD-10-CM | POA: Diagnosis present

## 2019-05-26 DIAGNOSIS — Z8759 Personal history of other complications of pregnancy, childbirth and the puerperium: Secondary | ICD-10-CM | POA: Diagnosis not present

## 2019-05-26 DIAGNOSIS — O139 Gestational [pregnancy-induced] hypertension without significant proteinuria, unspecified trimester: Secondary | ICD-10-CM | POA: Diagnosis not present

## 2019-05-26 LAB — URINALYSIS, ROUTINE W REFLEX MICROSCOPIC
Bilirubin Urine: NEGATIVE
Glucose, UA: NEGATIVE mg/dL
Hgb urine dipstick: NEGATIVE
Ketones, ur: 5 mg/dL — AB
Leukocytes,Ua: NEGATIVE
Nitrite: NEGATIVE
Protein, ur: NEGATIVE mg/dL
Specific Gravity, Urine: 1.019 (ref 1.005–1.030)
pH: 6 (ref 5.0–8.0)

## 2019-05-26 LAB — BASIC METABOLIC PANEL
Anion gap: 11 (ref 5–15)
BUN: 7 mg/dL (ref 6–20)
CO2: 19 mmol/L — ABNORMAL LOW (ref 22–32)
Calcium: 8.4 mg/dL — ABNORMAL LOW (ref 8.9–10.3)
Chloride: 106 mmol/L (ref 98–111)
Creatinine, Ser: 0.95 mg/dL (ref 0.44–1.00)
GFR calc Af Amer: >60 mL/min (ref >60)
GFR calc non Af Amer: >60 mL/min (ref >60)
Glucose, Bld: 168 mg/dL — ABNORMAL HIGH (ref 70–99)
Potassium: 4.2 mmol/L (ref 3.5–5.1)
Sodium: 136 mmol/L (ref 135–145)

## 2019-05-26 LAB — PROTEIN / CREATININE RATIO, URINE
Creatinine, Urine: 110.7 mg/dL
Protein Creatinine Ratio: 0.09 mg/mg{Cre} (ref 0.00–0.15)
Total Protein, Urine: 10 mg/dL

## 2019-05-26 LAB — SARS CORONAVIRUS 2 BY RT PCR (HOSPITAL ORDER, PERFORMED IN ~~LOC~~ HOSPITAL LAB): SARS Coronavirus 2: NEGATIVE

## 2019-05-26 LAB — CBC
HCT: 42.1 % (ref 36.0–46.0)
Hemoglobin: 14.1 g/dL (ref 12.0–15.0)
MCH: 29.7 pg (ref 26.0–34.0)
MCHC: 33.5 g/dL (ref 30.0–36.0)
MCV: 88.8 fL (ref 80.0–100.0)
Platelets: 235 10*3/uL (ref 150–400)
RBC: 4.74 MIL/uL (ref 3.87–5.11)
RDW: 13.3 % (ref 11.5–15.5)
WBC: 12.5 10*3/uL — ABNORMAL HIGH (ref 4.0–10.5)
nRBC: 0 % (ref 0.0–0.2)

## 2019-05-26 LAB — TYPE AND SCREEN
ABO/RH(D): A POS
Antibody Screen: NEGATIVE

## 2019-05-26 LAB — COMPREHENSIVE METABOLIC PANEL
ALT: 14 U/L (ref 0–44)
AST: 18 U/L (ref 15–41)
Albumin: 3.5 g/dL (ref 3.5–5.0)
Alkaline Phosphatase: 98 U/L (ref 38–126)
Anion gap: 10 (ref 5–15)
BUN: 11 mg/dL (ref 6–20)
CO2: 20 mmol/L — ABNORMAL LOW (ref 22–32)
Calcium: 9.5 mg/dL (ref 8.9–10.3)
Chloride: 106 mmol/L (ref 98–111)
Creatinine, Ser: 1 mg/dL (ref 0.44–1.00)
GFR calc Af Amer: >60 mL/min (ref >60)
GFR calc non Af Amer: >60 mL/min (ref >60)
Glucose, Bld: 89 mg/dL (ref 70–99)
Potassium: 4 mmol/L (ref 3.5–5.1)
Sodium: 136 mmol/L (ref 135–145)
Total Bilirubin: 0.3 mg/dL (ref 0.3–1.2)
Total Protein: 6.4 g/dL — ABNORMAL LOW (ref 6.5–8.1)

## 2019-05-26 LAB — RPR: RPR Ser Ql: NONREACTIVE

## 2019-05-26 LAB — WET PREP, GENITAL
Clue Cells Wet Prep HPF POC: NONE SEEN
Sperm: NONE SEEN
Trich, Wet Prep: NONE SEEN
Yeast Wet Prep HPF POC: NONE SEEN

## 2019-05-26 LAB — ABO/RH: ABO/RH(D): A POS

## 2019-05-26 MED ORDER — TERBUTALINE SULFATE 1 MG/ML IJ SOLN
INTRAMUSCULAR | Status: AC
  Administered 2019-05-26: 0.25 mg via SUBCUTANEOUS
  Filled 2019-05-26: qty 1

## 2019-05-26 MED ORDER — MAGNESIUM SULFATE 40 GM/1000ML IV SOLN
2.0000 g/h | INTRAVENOUS | Status: AC
  Administered 2019-05-26: 2 g/h via INTRAVENOUS
  Filled 2019-05-26: qty 1000

## 2019-05-26 MED ORDER — OXYTOCIN 40 UNITS IN NORMAL SALINE INFUSION - SIMPLE MED: 2.5000 [IU]/h | INTRAVENOUS | Status: DC

## 2019-05-26 MED ORDER — TERBUTALINE SULFATE 1 MG/ML IJ SOLN
0.2500 mg | Freq: Once | INTRAMUSCULAR | Status: AC
  Administered 2019-05-26: 01:00:00 0.25 mg via SUBCUTANEOUS

## 2019-05-26 MED ORDER — PENICILLIN G POT IN DEXTROSE 60000 UNIT/ML IV SOLN
3.0000 10*6.[IU] | INTRAVENOUS | Status: DC
  Administered 2019-05-26: 3 10*6.[IU] via INTRAVENOUS
  Filled 2019-05-26 (×3): qty 50

## 2019-05-26 MED ORDER — LIDOCAINE HCL (PF) 1 % IJ SOLN: 30.0000 mL | INTRAMUSCULAR | Status: DC | PRN

## 2019-05-26 MED ORDER — MAGNESIUM SULFATE 40 GM/1000ML IV SOLN
2.0000 g/h | INTRAVENOUS | Status: DC
  Administered 2019-05-26: 01:00:00 2 g/h via INTRAVENOUS
  Filled 2019-05-26: qty 1000

## 2019-05-26 MED ORDER — OXYTOCIN BOLUS FROM INFUSION: 500.0000 mL | Freq: Once | INTRAVENOUS | Status: DC

## 2019-05-26 MED ORDER — LACTATED RINGERS IV SOLN: 500.0000 mL | INTRAVENOUS | Status: DC | PRN

## 2019-05-26 MED ORDER — DOCUSATE SODIUM 100 MG PO CAPS
100.0000 mg | ORAL_CAPSULE | Freq: Every day | ORAL | Status: DC
  Filled 2019-05-26: qty 1

## 2019-05-26 MED ORDER — BETAMETHASONE SOD PHOS & ACET 6 (3-3) MG/ML IJ SUSP
12.0000 mg | INTRAMUSCULAR | Status: AC
  Administered 2019-05-26 – 2019-05-27 (×2): 12 mg via INTRAMUSCULAR
  Filled 2019-05-26 (×2): qty 5

## 2019-05-26 MED ORDER — CALCIUM CARBONATE ANTACID 500 MG PO CHEW: 2.0000 | CHEWABLE_TABLET | ORAL | Status: DC | PRN

## 2019-05-26 MED ORDER — LACTATED RINGERS IV SOLN
INTRAVENOUS | Status: DC
  Administered 2019-05-26 (×2): via INTRAVENOUS
  Administered 2019-05-27: 02:00:00 75 mL/h via INTRAVENOUS

## 2019-05-26 MED ORDER — ONDANSETRON HCL 4 MG/2ML IJ SOLN: 4.0000 mg | Freq: Four times a day (QID) | INTRAMUSCULAR | Status: DC | PRN

## 2019-05-26 MED ORDER — SODIUM CHLORIDE 0.9 % IV SOLN
5.0000 10*6.[IU] | Freq: Once | INTRAVENOUS | Status: AC
  Administered 2019-05-26: 5 10*6.[IU] via INTRAVENOUS
  Filled 2019-05-26: qty 5

## 2019-05-26 MED ORDER — ZOLPIDEM TARTRATE 5 MG PO TABS: 5.0000 mg | ORAL_TABLET | Freq: Every evening | ORAL | Status: DC | PRN

## 2019-05-26 MED ORDER — OXYCODONE-ACETAMINOPHEN 5-325 MG PO TABS: 1.0000 | ORAL_TABLET | ORAL | Status: DC | PRN

## 2019-05-26 MED ORDER — PRENATAL MULTIVITAMIN CH
1.0000 | ORAL_TABLET | Freq: Every day | ORAL | Status: DC
  Administered 2019-05-26: 1 via ORAL
  Filled 2019-05-26 (×2): qty 1

## 2019-05-26 MED ORDER — OXYCODONE-ACETAMINOPHEN 5-325 MG PO TABS: 2.0000 | ORAL_TABLET | ORAL | Status: DC | PRN

## 2019-05-26 MED ORDER — MAGNESIUM SULFATE BOLUS VIA INFUSION
4.0000 g | Freq: Once | INTRAVENOUS | Status: AC
  Administered 2019-05-26: 4 g via INTRAVENOUS
  Filled 2019-05-26: qty 1000

## 2019-05-26 MED ORDER — SOD CITRATE-CITRIC ACID 500-334 MG/5ML PO SOLN: 30.0000 mL | ORAL | Status: DC | PRN

## 2019-05-26 MED ORDER — ACETAMINOPHEN 325 MG PO TABS: 650.0000 mg | ORAL_TABLET | ORAL | Status: DC | PRN

## 2019-05-26 NOTE — Progress Notes (Signed)
Pt states she feels like she is peeing herself when she feels a ctx. Notified Dr. Ilda Basset. Received verbal order to collect an amnisure. MD en route to department.

## 2019-05-26 NOTE — MAU Note (Signed)
Reports having contractions that have been getting stronger throughout the night.  No LOF/VB.  Reports she may have lost her mucous plug last week.  Feeling a lot of pressure.  + FM.

## 2019-05-26 NOTE — Consult Note (Signed)
   Prenatal Consult       05/26/2019  12:47 AM   I was asked by Anyanwu to consult on this patient for possible preterm delivery.  She is a 23 yo G1 with uncomplicated pregnancy until she presented tonight in active labor.  She will be receiving tocolytic therapies and betamethasone.  I explained that the neonatal intensive care team would be present for the delivery, should she deliver pre-term, and outlined the likely delivery room course for this baby including routine resuscitation and NRP-guided approaches to the treatment of respiratory distress. We discussed other common problems associated with prematurity including respiratory distress syndrome, apnea, feeding issues, temperature regulation, and infection risk.    We discussed the average length of stay but I noted that the actual LOS would depend on the severity of problems encountered and response to treatments.  We discussed visitation policies and the resources available while her child is in the hospital.   Thank you for involving [place] in the care of this patient. A member of our team will be available should the family have additional questions.  Time for consultation approximately 20 minutes.   _____________________ Electronically Signed By: Towana Badger, MD, MS Neonatologist

## 2019-05-26 NOTE — Progress Notes (Signed)
Labor Progress Note Sandra Palmer is a 23 y.o. G1P0000 at [redacted]w[redacted]d presented for preterm labor  S:  Patient sleeping. Denies any contractions.  O:  BP 114/74   Pulse 91   Temp 98.7 F (37.1 C) (Oral)   Resp 18   Ht 5\' 7"  (1.702 m)   Wt 69.7 kg   LMP 10/10/2018   BMI 24.05 kg/m   Fetal Tracing:  Baseline: 140 Variability: moderate Accels: 15x15 Decels: none  Toco: nonr   CVE: Dilation: 4 Effacement (%): 90 Cervical Position: Middle Station: 0 Presentation: Vertex Exam by:: Sharmon Revere CNM   A&P: 23 y.o. G1P0000 [redacted]w[redacted]d observation for preterm labor #Labor: Cervix unchanged from previous exam. No contractions noted and patient denies any pain. Will transfer to [facility] for observation. #Pain: none #FWB: Cat 1 #GBS unknown, PCN   Wende Mott, CNM 7:35 AM

## 2019-05-26 NOTE — Progress Notes (Signed)
CTSP for ?[facility]  Patient states that she feels like she has to go void when she has a UC, which is about 3 in the past hour, but she doesn't have any LOF or watery discharge come out. Fetus category I and toco quiet. SVE 4-5/90/0/cephalic, bow intact  Will leave mg on until 12 hours s/p 2nd dose b/c of effacement and concern that will go in PTL again once Mg is off. Pt has received PCN x 2, okay to hold off on further abx for now. Restart if pprom or labors.  Durene Romans MD Attending [facility] (Faculty Practice) 05/26/2019 Time: 1109am

## 2019-05-26 NOTE — Progress Notes (Signed)
Dr.Arnold @[facility]  to assess pt.  Pt c/o ctx's 7/10 pain scale during ctx's. +FM as per pt. Dr.Arnold  d/w pt  POC.  Will continue to monitor pt. No new orders at this time.

## 2019-05-26 NOTE — H&P (Addendum)
OBSTETRIC ADMISSION HISTORY AND PHYSICAL  Sandra Palmer is a 23 y.o. female G1P0000 with IUP at 35w4dby LMP confirmed by 11 week ultrasound presenting for contractions. Pt reports that she has had contraction pain all day that worsened throughout the evening. Since arriving to labor and delivery, patient reports that the pain has lessened and is less frequent. She denies LOF or vaginal bleeding. Reports good fetal movement. She denies HA, vision changes or RUQ pain.  [facility]  Support person in labor: FOB  Ultrasounds . Anatomy U/S: normal  Prenatal History/Complications: . Preterm labor . Alpha thalassemia silent carrier  Past Medical History: Past Medical History:  Diagnosis Date  . Allergy   . Chlamydia 05/18/2015  . History of Papanicolaou smear of cervix 05/18/2015   Nil/Pos    Past Surgical History: Past Surgical History:  Procedure Laterality Date  . HERNIA REPAIR      Obstetrical History: OB History    Gravida  1   Para  0   Term  0   Preterm  0   AB  0   Living  0     SAB  0   TAB  0   Ectopic  0   Multiple  0   Live Births              Social History: Social History   Socioeconomic History  . Marital status: Single    Spouse name: Not on file  . Number of children: Not on file  . Years of education: Not on file  . Highest education level: Not on file  Occupational History  . Not on file  Social Needs  . Financial resource strain: Not on file  . Food insecurity    Worry: Not on file    Inability: Not on file  . Transportation needs    Medical: Not on file    Non-medical: Not on file  Tobacco Use  . Smoking status: Never Smoker  . Smokeless tobacco: Never Used  Substance and Sexual Activity  . Alcohol use: Not Currently  . Drug use: No  . Sexual activity: Yes    Partners: Male    Birth control/protection: None  Lifestyle  . Physical activity    Days per week: Not on file     Minutes per session: Not on file  . Stress: Not on file  Relationships  . Social [place] phone: Not on file    Gets together: Not on file    Attends religious service: Not on file    Active member of club or organization: Not on file    Attends meetings of clubs or organizations: Not on file    Relationship status: Not on file  Other Topics Concern  . Not on file  Social History Narrative   Lives with mother    Family History: History reviewed. No pertinent family history.  Allergies: Allergies  Allergen Reactions  . Azithromycin Swelling and Rash    Facility-Administered Medications Prior to Admission  Medication Dose Route Frequency Provider Last Rate Last Dose  . Tdap (BOOSTRIX) injection 0.5 mL  0.5 mL Intramuscular Once Anyanwu, Ugonna A, MD       Medications Prior to Admission  Medication Sig Dispense Refill Last Dose  . Blood Pressure Monitor KIT 1 each by Does not apply route daily. 1 each 0   . cyclobenzaprine (FLEXERIL) 10 MG tablet Take 1 tablet (10  mg total) by mouth 2 (two) times daily as needed for muscle spasms. 20 tablet 0   . fluticasone (FLONASE) 50 MCG/ACT nasal spray Place 1 spray into both nostrils daily. 1 g 2   . Prenatal MV-Min-FA-Omega-3 (PRENATAL GUMMIES/DHA & FA) 0.4-32.5 MG CHEW Chew by mouth.     . terconazole (TERAZOL 3) 0.8 % vaginal cream Place 1 applicator vaginally at bedtime. Apply nightly for three nights. 20 g 0      Review of Systems  All systems reviewed and negative except as stated in HPI  Physical Exam  Blood pressure (!) 150/90, pulse (!) 110, temperature 98.8 F (37.1 C), temperature source Oral, resp. rate 18, height '5\' 7"'$  (1.702 m), weight 69.7 kg, last menstrual period 10/10/2018. General appearance: alert, cooperative, appears stated age and no distress Lungs: no respiratory distress Heart: regular rate  Abdomen: soft, non-tender; gravid Extremities: Homans sign is negative, no sign of DVT Presentation:  cephalic   Fetal Well-Being: -- FHR 155, moderate variability, +accels, no decels  Uterine activity: -- none seen on toco  Dilation: 4 Effacement (%): 90 Station: 0 Exam by:: Dr. Darene Lamer  Prenatal labs: ABO, Rh: --/--/PENDING (11/09 0028) Antibody: PENDING (11/09 0028) Rubella: 22.10 (06/11 0922) RPR: Non Reactive (10/08 0950)  HBsAg: Negative (06/11 3474)  HIV: Non Reactive (10/08 0950)  GBS: culture pending Glucola: normal Genetic screening: NIPS low risk  Prenatal Transfer Tool  Maternal Diabetes: No Genetic Screening: Normal Maternal Ultrasounds/Referrals: Other: Fetal Ultrasounds or other Referrals:  None Maternal Substance Abuse:  No Significant Maternal Medications:  None Significant Maternal Lab Results: None and Other: GBS pending  Results for orders placed or performed during the hospital encounter of 05/26/19 (from the past 24 hour(s))  Type and screen [place]   Collection Time: 05/26/19 12:28 AM  Result Value Ref Range   ABO/RH(D) PENDING    Antibody Screen PENDING    Sample Expiration      05/29/2019,2359 Performed at [facility], [place] [address] [place], [place] [address]     Patient Active Problem List   Diagnosis Date Noted  . Preterm labor 05/26/2019  . Rash and nonspecific skin eruption 05/08/2019  . Alpha thalassemia silent carrier 01/23/2019  . Supervision of other normal pregnancy, antepartum 12/26/2018  . Allergic rhinitis 11/10/2013    Assessment/Plan:  [name] is a 23 y.o. G1P0000 at 12w4dhere for preterm labor.  Labor:  -- Will start Mag sulfate for tocolysis and give BMZ. -- GBS culture pending. Will start PCN prophylactically.  -- BPs mildly elevated 140s-150s/90's. Pt asymptomatic. CMP and protein creatinine ratio ordered.  -- NICU consult ordered -- Plan to recheck patient in morning unless patient begins to start having contractions again.    Postpartum Planning -- Breast --  Contraception: Depo   I confirm that I have verified the information documented in the nurse midwife student's note and that I have also personally reperformed the history, physical exam and all medical decision making activities of this service and have verified that all service and findings are accurately documented in this student's note.   Lengthy discussion with patient desire to stop labor for fetal well being. Discussed if unchanged, possibility of going home after BMZ doses even if she is 4cm.   Elevated BP, asymptomatic. Will get labs and continue to monitor.   Patient denies any contractions at this time. Instructed to let RN know if they return.   NWende Mott CNorth Dakota11/03/2019 3:21  AM

## 2019-05-27 ENCOUNTER — Encounter (HOSPITAL_COMMUNITY): Payer: Self-pay

## 2019-05-27 DIAGNOSIS — Z3A32 32 weeks gestation of pregnancy: Secondary | ICD-10-CM

## 2019-05-27 LAB — COMPREHENSIVE METABOLIC PANEL
ALT: 18 U/L (ref 0–44)
AST: 23 U/L (ref 15–41)
Albumin: 3.6 g/dL (ref 3.5–5.0)
Alkaline Phosphatase: 118 U/L (ref 38–126)
Anion gap: 10 (ref 5–15)
BUN: 5 mg/dL — ABNORMAL LOW (ref 6–20)
CO2: 22 mmol/L (ref 22–32)
Calcium: 6.8 mg/dL — ABNORMAL LOW (ref 8.9–10.3)
Chloride: 103 mmol/L (ref 98–111)
Creatinine, Ser: 1 mg/dL (ref 0.44–1.00)
GFR calc Af Amer: >60 mL/min (ref >60)
GFR calc non Af Amer: >60 mL/min (ref >60)
Glucose, Bld: 118 mg/dL — ABNORMAL HIGH (ref 70–99)
Potassium: 3.9 mmol/L (ref 3.5–5.1)
Sodium: 135 mmol/L (ref 135–145)
Total Bilirubin: 0.5 mg/dL (ref 0.3–1.2)
Total Protein: 7.5 g/dL (ref 6.5–8.1)

## 2019-05-27 LAB — CBC
HCT: 43.4 % (ref 36.0–46.0)
Hemoglobin: 14.6 g/dL (ref 12.0–15.0)
MCH: 29.6 pg (ref 26.0–34.0)
MCHC: 33.6 g/dL (ref 30.0–36.0)
MCV: 87.9 fL (ref 80.0–100.0)
Platelets: 239 10*3/uL (ref 150–400)
RBC: 4.94 MIL/uL (ref 3.87–5.11)
RDW: 13.6 % (ref 11.5–15.5)
WBC: 13.6 10*3/uL — ABNORMAL HIGH (ref 4.0–10.5)
nRBC: 0 % (ref 0.0–0.2)

## 2019-05-27 LAB — GLUCOSE, CAPILLARY
Glucose-Capillary: 128 mg/dL — ABNORMAL HIGH (ref 70–99)
Glucose-Capillary: 101 mg/dL — ABNORMAL HIGH (ref 70–99)
Glucose-Capillary: 92 mg/dL (ref 70–99)

## 2019-05-27 MED ORDER — PHENYLEPHRINE 40 MCG/ML (10ML) SYRINGE FOR IV PUSH (FOR BLOOD PRESSURE SUPPORT): 80.0000 ug | PREFILLED_SYRINGE | INTRAVENOUS | Status: DC | PRN

## 2019-05-27 MED ORDER — EPHEDRINE 5 MG/ML INJ: 10.0000 mg | INTRAVENOUS | Status: DC | PRN

## 2019-05-27 MED ORDER — LACTATED RINGERS IV SOLN
INTRAVENOUS | Status: DC
  Administered 2019-05-27: 13:00:00 via INTRAVENOUS

## 2019-05-27 MED ORDER — LIDOCAINE HCL (PF) 1 % IJ SOLN: 30.0000 mL | INTRAMUSCULAR | Status: DC | PRN

## 2019-05-27 MED ORDER — FENTANYL-BUPIVACAINE-NACL 0.5-0.125-0.9 MG/250ML-% EP SOLN: 12.0000 mL/h | EPIDURAL | Status: DC | PRN

## 2019-05-27 MED ORDER — DIPHENHYDRAMINE HCL 50 MG/ML IJ SOLN: 12.5000 mg | INTRAMUSCULAR | Status: DC | PRN

## 2019-05-27 MED ORDER — PENICILLIN G POT IN DEXTROSE 60000 UNIT/ML IV SOLN
3.0000 10*6.[IU] | INTRAVENOUS | Status: DC
  Administered 2019-05-27 (×2): 3 10*6.[IU] via INTRAVENOUS
  Filled 2019-05-27 (×7): qty 50

## 2019-05-27 MED ORDER — SOD CITRATE-CITRIC ACID 500-334 MG/5ML PO SOLN: 30.0000 mL | ORAL | Status: DC | PRN

## 2019-05-27 MED ORDER — SODIUM CHLORIDE 0.9 % IV SOLN
5.0000 10*6.[IU] | Freq: Once | INTRAVENOUS | Status: AC
  Administered 2019-05-27: 5 10*6.[IU] via INTRAVENOUS
  Filled 2019-05-27: qty 5

## 2019-05-27 MED ORDER — LACTATED RINGERS IV SOLN: 500.0000 mL | Freq: Once | INTRAVENOUS | Status: DC

## 2019-05-27 MED ORDER — OXYTOCIN 40 UNITS IN NORMAL SALINE INFUSION - SIMPLE MED
2.5000 [IU]/h | INTRAVENOUS | Status: DC
  Filled 2019-05-27: qty 1000

## 2019-05-27 MED ORDER — OXYTOCIN BOLUS FROM INFUSION
500.0000 mL | Freq: Once | INTRAVENOUS | Status: AC
  Administered 2019-05-27: 500 mL via INTRAVENOUS

## 2019-05-27 MED ORDER — FENTANYL CITRATE (PF) 100 MCG/2ML IJ SOLN: 50.0000 ug | INTRAMUSCULAR | Status: DC | PRN

## 2019-05-27 MED ORDER — LACTATED RINGERS IV SOLN: 500.0000 mL | INTRAVENOUS | Status: DC | PRN

## 2019-05-27 MED ORDER — ACETAMINOPHEN 325 MG PO TABS: 650.0000 mg | ORAL_TABLET | ORAL | Status: DC | PRN

## 2019-05-27 MED ORDER — ONDANSETRON HCL 4 MG/2ML IJ SOLN: 4.0000 mg | Freq: Four times a day (QID) | INTRAMUSCULAR | Status: DC | PRN

## 2019-05-27 NOTE — Progress Notes (Signed)
LABOR PROGRESS NOTE  Sandra Palmer is a 23 y.o. G1P0000 at [redacted]w[redacted]d  admitted for preterm labor.  Subjective: Feels comfortable Denies headache, vision changes, chest pain, SOB, LE edema  Objective: BP 135/82   Pulse 71   Temp 98.2 F (36.8 C) (Oral)   Resp 18   Ht 5\' 7"  (1.702 m)   Wt 69.7 kg   LMP 10/10/2018   SpO2 100%   BMI 24.05 kg/m  or  Vitals:   05/27/19 0340 05/27/19 0342 05/27/19 0756 05/27/19 1203  BP: (!) 135/95 (!) 133/91 131/78 135/82  Pulse: 69 68 82 71  Resp:  18 18 18   Temp:  98.2 F (36.8 C) 97.8 F (36.6 C) 98.2 F (36.8 C)  TempSrc:  Oral Oral Oral  SpO2:  100% 99% 100%  Weight:      Height:         Dilation: 6 Effacement (%): 90 Cervical Position: Middle Station: 0 Presentation: Vertex Exam by:: pickens md FHT: baseline rate 125, moderate varibility, -acel, -decel Toco: rare ctx  Labs: Lab Results  Component Value Date   WBC 12.5 (H) 05/26/2019   HGB 14.1 05/26/2019   HCT 42.1 05/26/2019   MCV 88.8 05/26/2019   PLT 235 05/26/2019    Patient Active Problem List   Diagnosis Date Noted  . Preterm labor 05/26/2019  . Rash and nonspecific skin eruption 05/08/2019  . Alpha thalassemia silent carrier 01/23/2019  . Supervision of other normal pregnancy, antepartum 12/26/2018  . Allergic rhinitis 11/10/2013    Assessment / Plan: 23 y.o. G1P0000 at [redacted]w[redacted]d here for preterm labor.  Labor: Cervical exam progressed spontaneously from 4-5 > 6-7 while on OBSC, transferred to L&D for discontinuation of Mg tocolysis, will observe and manage expectantly.  Fetal Wellbeing:  Cat I Pain Control:  Considering epidural GBS: Unknown, resume penicillin prophylaxis Anticipated MOD:  SVD  Preterm labor: s/p betamethasone x2, last dose 05/27/2019 @ 0036 (today). Empiric penicillin prophylaxis for GBS, culture pending. S/p NICU consult.   GHTN: ongoing mild range BP's since arrival, unremarkable PreE labs. Notably Cr is 0.95-1.00 but no priors to compare  with. Repeat labs now.   Hyperglycemia: Glucose 168 on last BMP from 05/26/2019, likely in setting of betamethasone. 2hr GTT was normal on 04/24/2019. Will follow sugars q4h for 2-3 checks, if normal can d/c.   Augustin Coupe, MD/MPH OB Fellow  05/27/2019, 12:52 PM

## 2019-05-27 NOTE — Progress Notes (Signed)
LABOR PROGRESS NOTE  Sandra Palmer is a 23 y.o. G1P0000 at [redacted]w[redacted]d  admitted for preterm labor.  Subjective: Some discomfort, difficult to describe  Objective: BP (!) 105/58   Pulse 69   Temp 98.2 F (36.8 C) (Oral)   Resp 18   Ht 5\' 7"  (1.702 m)   Wt 69.7 kg   LMP 10/10/2018   SpO2 100%   BMI 24.05 kg/m  or  Vitals:   05/27/19 0342 05/27/19 0756 05/27/19 1203 05/27/19 1534  BP: (!) 133/91 131/78 135/82 (!) 105/58  Pulse: 68 82 71 69  Resp: 18 18 18 18   Temp: 98.2 F (36.8 C) 97.8 F (36.6 C) 98.2 F (36.8 C)   TempSrc: Oral Oral Oral   SpO2: 100% 99% 100%   Weight:      Height:         Dilation: 6.5 Effacement (%): 100 Cervical Position: Middle Station: 0 Presentation: Vertex Exam by:: Ana Woodroof FHT: baseline rate 130, moderate varibility, acel, -decel Toco: rare ctx, occasional irritability  Labs: Lab Results  Component Value Date   WBC 13.6 (H) 05/27/2019   HGB 14.6 05/27/2019   HCT 43.4 05/27/2019   MCV 87.9 05/27/2019   PLT 239 05/27/2019    Patient Active Problem List   Diagnosis Date Noted  . Preterm labor 05/26/2019  . Rash and nonspecific skin eruption 05/08/2019  . Alpha thalassemia silent carrier 01/23/2019  . Supervision of other normal pregnancy, antepartum 12/26/2018  . Allergic rhinitis 11/10/2013    Assessment / Plan: 23 y.o. G1P0000 at [redacted]w[redacted]d here for preterm labor.  Labor: Cervical exam progressed spontaneously from 4-5 > 6-7 while on OBSC, transferred to L&D for discontinuation of Mg tocolysis at 1200. Since then no significant change and rare contractions, continue expectant management.   Fetal Wellbeing:  Cat I Pain Control:  Considering epidural GBS: Unknown, onpenicillin prophylaxis Anticipated MOD:  SVD  Preterm labor: s/p betamethasone x2, last dose 05/27/2019 @ 0036 (today). Empiric penicillin prophylaxis for GBS, culture pending. S/p NICU consult.   GHTN: multiple mild range BP's since arrival, unremarkable PreE labs on  admission though notably Cr is 0.95-1.00 but no priors to compare with. Repeat labs after transfer to L&D show unchanged CBC/CMP, UPCR pending.   Hyperglycemia: Glucose 168 on last BMP from 05/26/2019, likely in setting of betamethasone. 2hr GTT was normal on 04/24/2019. Started on q4h CBG, initial=128, ctm.  Augustin Coupe, MD/MPH OB Fellow  05/27/2019, 5:34 PM

## 2019-05-27 NOTE — Consult Note (Signed)
Delivery Note:  SVD    05/27/2019  11:56 PM  I was called to the delivery room at the request of the patient's obstetrician (Dr. Ellery) for SVD at 32 and 5/[redacted] weeks gestation.  PRENATAL HX:  This is a 23 y/o G1P0 at 32 and 5/[redacted] weeks gestation who was admitted in preterm labor.  She received tocolysis and BMZ x2, last on 11/10.  She is GBS unknown and received penicillin prophylaxis.    DELIVERY:  Infant was vigorous at delivery, initially requiring no resuscitation other than standard warming, drying and stimulation.  A pulse oximeter was applied and O2 saturations in 60s at 2 minutes.  CPAP applied from 2 minutes of age to 5 minutes of age, then O2 saturations remained in 90s in room air.  APGARs 8 and 9.  Exam within normal limits.    _____________________ Electronically Signed By: Jannessa Ogden, MD Neonatologist 

## 2019-05-27 NOTE — Progress Notes (Addendum)
OB Note  Came to see patient for SVE prior to d/c'ing Mg.  Patient feels UCs but not painful or consistent; pt unsure of how often. No LOF or decreased FM  VS normal and stable  Category I with accels Toco quiet  NAD SVE: 8-2/42/3/NTI intact/cephalic  A/P: more cx dilation vs yesterday. Increase in dilation vs my exam yesterday. Will transfer to L&D and d/c Mg there and observe patient. GBS still pending so will restart pcn. Pt got two doses yesterday S/p nicu consult  Durene Romans MD Attending Center for [place] (Faculty Practice) 05/27/2019 Time: 1200

## 2019-05-27 NOTE — Progress Notes (Signed)
Patient ID: Sandra Palmer, female   DOB: July 25, 1995, 23 y.o.   MRN: [id number] Breathing through contractions Coping well, wants to be checked  Vitals:   05/27/19 1534 05/27/19 1733 05/27/19 1846 05/27/19 2035  BP: (!) 105/58 138/88 115/88 119/69  Pulse: 69 76 74 66  Resp: 18 18 18 18   Temp:   98.3 F (36.8 C) 98.4 F (36.9 C)  TempSrc:   Axillary Oral  SpO2:      Weight:      Height:       FHR reassuring UCs every 2 min  Dilation: 7.5 Effacement (%): 100 Cervical Position: Middle Station: 0 Presentation: Vertex Exam by:: Hansel Feinstein, CNM  Wants epidural, will order

## 2019-05-28 ENCOUNTER — Telehealth: Payer: Self-pay | Admitting: Radiology

## 2019-05-28 ENCOUNTER — Encounter (HOSPITAL_COMMUNITY): Payer: Self-pay

## 2019-05-28 DIAGNOSIS — Z3A32 32 weeks gestation of pregnancy: Secondary | ICD-10-CM

## 2019-05-28 DIAGNOSIS — Z8759 Personal history of other complications of pregnancy, childbirth and the puerperium: Secondary | ICD-10-CM | POA: Diagnosis not present

## 2019-05-28 DIAGNOSIS — O139 Gestational [pregnancy-induced] hypertension without significant proteinuria, unspecified trimester: Secondary | ICD-10-CM | POA: Diagnosis not present

## 2019-05-28 LAB — CBC
HCT: 37.9 % (ref 36.0–46.0)
Hemoglobin: 12.7 g/dL (ref 12.0–15.0)
MCH: 29.7 pg (ref 26.0–34.0)
MCHC: 33.5 g/dL (ref 30.0–36.0)
MCV: 88.6 fL (ref 80.0–100.0)
Platelets: 199 10*3/uL (ref 150–400)
RBC: 4.28 MIL/uL (ref 3.87–5.11)
RDW: 13.5 % (ref 11.5–15.5)
WBC: 17.3 10*3/uL — ABNORMAL HIGH (ref 4.0–10.5)
nRBC: 0 % (ref 0.0–0.2)

## 2019-05-28 LAB — CULTURE, BETA STREP (GROUP B ONLY)

## 2019-05-28 MED ORDER — IBUPROFEN 600 MG PO TABS
600.0000 mg | ORAL_TABLET | Freq: Four times a day (QID) | ORAL | Status: DC
  Administered 2019-05-28 – 2019-05-29 (×5): 600 mg via ORAL
  Filled 2019-05-28 (×6): qty 1

## 2019-05-28 MED ORDER — COCONUT OIL OIL: 1.0000 "application " | TOPICAL_OIL | Status: DC | PRN

## 2019-05-28 MED ORDER — ZOLPIDEM TARTRATE 5 MG PO TABS: 5.0000 mg | ORAL_TABLET | Freq: Every evening | ORAL | Status: DC | PRN

## 2019-05-28 MED ORDER — WITCH HAZEL-GLYCERIN EX PADS: 1.0000 "application " | MEDICATED_PAD | CUTANEOUS | Status: DC | PRN

## 2019-05-28 MED ORDER — DIPHENHYDRAMINE HCL 25 MG PO CAPS: 25.0000 mg | ORAL_CAPSULE | Freq: Four times a day (QID) | ORAL | Status: DC | PRN

## 2019-05-28 MED ORDER — ACETAMINOPHEN 325 MG PO TABS: 650.0000 mg | ORAL_TABLET | ORAL | Status: DC | PRN

## 2019-05-28 MED ORDER — TETANUS-DIPHTH-ACELL PERTUSSIS 5-2.5-18.5 LF-MCG/0.5 IM SUSP: 0.5000 mL | Freq: Once | INTRAMUSCULAR | Status: DC

## 2019-05-28 MED ORDER — SENNOSIDES-DOCUSATE SODIUM 8.6-50 MG PO TABS
2.0000 | ORAL_TABLET | ORAL | Status: DC
  Administered 2019-05-28: 2 via ORAL
  Filled 2019-05-28: qty 2

## 2019-05-28 MED ORDER — PRENATAL MULTIVITAMIN CH
1.0000 | ORAL_TABLET | Freq: Every day | ORAL | Status: DC
  Administered 2019-05-28 – 2019-05-29 (×2): 1 via ORAL
  Filled 2019-05-28 (×2): qty 1

## 2019-05-28 MED ORDER — BENZOCAINE-MENTHOL 20-0.5 % EX AERO: 1.0000 "application " | INHALATION_SPRAY | CUTANEOUS | Status: DC | PRN

## 2019-05-28 MED ORDER — DIBUCAINE (PERIANAL) 1 % EX OINT: 1.0000 "application " | TOPICAL_OINTMENT | CUTANEOUS | Status: DC | PRN

## 2019-05-28 MED ORDER — ONDANSETRON HCL 4 MG PO TABS: 4.0000 mg | ORAL_TABLET | ORAL | Status: DC | PRN

## 2019-05-28 MED ORDER — ONDANSETRON HCL 4 MG/2ML IJ SOLN: 4.0000 mg | INTRAMUSCULAR | Status: DC | PRN

## 2019-05-28 MED ORDER — SIMETHICONE 80 MG PO CHEW: 80.0000 mg | CHEWABLE_TABLET | ORAL | Status: DC | PRN

## 2019-05-28 NOTE — Discharge Summary (Signed)
Postpartum Discharge Summary     Patient Name: Sandra Palmer DOB: April 05, 1996 MRN: [id number]  Date of admission: 05/26/2019 Delivering Provider: Lona Millard  and Hansel Feinstein CNM  Date of discharge: 05/29/2019  Admitting diagnosis: Preterm labor at 32/4.  Additional problems: alpha thal carrier.      Discharge diagnosis: Preterm Pregnancy Delivered. GHTN                                                                                            Post partum procedures:None  Augmentation: AROM @ delivery  Complications: None  Hospital course: patient admitted at 32/4 weeks with preterm labor at 4cm. She received betamethasone course and Magnesium with Mg turned off 12 hours after 2nd BMZ injection. Later that same day, she progressed in PTL and delivered at late on 11/10 vaginally over an intact perineum. She did have some elevated BPs in the mild range but labs and ROS was negative so didn't have a dx of severe pre-eclampsia. She had an uncomplicated PP course and was started on norvasc 79m qday on the day of discharge and her ROS remained negative.    Magnesium Sulfate received: Yes for preterm labor BMZ received: Yes Rhophylac:N/A MMR:No Transfusion:No  Physical exam  Vitals:   05/29/19 0327 05/29/19 0631 05/29/19 1023 05/29/19 1025  BP: (!) 141/100 121/78 (!) 136/103 (!) 130/92  Pulse: 60 73 65 62  Resp: 16     Temp: 98.2 F (36.8 C)     TempSrc: Oral     SpO2: 100%     Weight:      Height:       General: alert Lochia: appropriate Uterine Fundus: nttp Incision: Healing well with no significant drainage DVT Evaluation: No evidence of DVT seen on physical exam. Labs: Lab Results  Component Value Date   WBC 17.3 (H) 05/28/2019   HGB 12.7 05/28/2019   HCT 37.9 05/28/2019   MCV 88.6 05/28/2019   PLT 199 05/28/2019   CMP Latest Ref Rng & Units 05/27/2019  Glucose 70 - 99 mg/dL 118(H)  BUN 6 - 20 mg/dL 5(L)  Creatinine 0.44 - 1.00 mg/dL 1.00  Sodium 135  - 145 mmol/L 135  Potassium 3.5 - 5.1 mmol/L 3.9  Chloride 98 - 111 mmol/L 103  CO2 22 - 32 mmol/L 22  Calcium 8.9 - 10.3 mg/dL 6.8(L)  Total Protein 6.5 - 8.1 g/dL 7.5  Total Bilirubin 0.3 - 1.2 mg/dL 0.5  Alkaline Phos 38 - 126 U/L 118  AST 15 - 41 U/L 23  ALT 0 - 44 U/L 18    Discharge instruction: per After Visit Summary and "Baby and Me Booklet".  After visit meds:  Allergies as of 05/29/2019      Reactions   Azithromycin Swelling, Rash      Medication List    STOP taking these medications   terconazole 0.8 % vaginal cream Commonly known as: TERAZOL 3     TAKE these medications   acetaminophen 325 MG tablet Commonly known as: Tylenol Take 2 tablets (650 mg total) by mouth every 6 (six) hours as needed (for pain  scale < 4).   amLODipine 5 MG tablet Commonly known as: NORVASC Take 1 tablet (5 mg total) by mouth daily. Notes to patient: Start 11/13 by 10am    Blood Pressure Monitor Kit 1 each by Does not apply route daily.   cyclobenzaprine 10 MG tablet Commonly known as: FLEXERIL Take 1 tablet (10 mg total) by mouth 2 (two) times daily as needed for muscle spasms.   fluticasone 50 MCG/ACT nasal spray Commonly known as: Flonase Place 1 spray into both nostrils daily.   ibuprofen 600 MG tablet Commonly known as: ADVIL Take 1 tablet (600 mg total) by mouth every 6 (six) hours as needed.   Prenatal Gummies/DHA & FA 0.4-32.5 MG Chew Chew by mouth.       Diet: routine diet  Activity: Advance as tolerated. Pelvic rest for 6 weeks.   Outpatient follow up:1wk for bp check Follow up Appt: Future Appointments  Date Time Provider [place]  06/05/2019 11:15 AM Aletha Halim, MD [facility]   Follow up Visit: Request sent for one week BP check   Please schedule this patient for Postpartum visit in: 1 week with the following provider: Any provider For C/S patients schedule nurse incision check in weeks 2 weeks: no High risk pregnancy  complicated by: HTN and Preterm Labor Delivery mode:  SVD Anticipated Birth Control:  Depo PP Procedures needed: BP check  Schedule Integrated BH visit: no      Newborn Data: Live born female  Birth Weight: 3 lb 13.4 oz (1740 g) APGAR: 8, 9  Newborn Delivery   Birth date/time: 05/27/2019 23:40:00 Delivery type: Vaginal, Spontaneous      Baby Feeding: Breast Disposition:NICU   05/29/2019 Aletha Halim, MD

## 2019-05-28 NOTE — Lactation Note (Signed)
This note was copied from a baby's chart. Lactation Consultation Note  Patient Name: Sandra Palmer HUOHF'G Date: 05/28/2019 Reason for consult: Initial assessment;Primapara;1st time breastfeeding;NICU baby;Preterm <34wks;Infant < 6lbs  WIC [facility]. Mom's anticipated date of discharge is tomorrow.  Maternal Data Formula Feeding for Exclusion: No Has patient been taught Hand Expression?: Yes Does the patient have breastfeeding experience prior to this delivery?: No  Feeding Feeding Type: Donor Breast Milk  LATCH Score                   Interventions Interventions: Breast feeding basics reviewed;Breast massage;Hand express;Breast compression;DEBP  Lactation Tools Discussed/Used Tools: Pump Breast pump type: Double-Electric Breast Pump WIC Program: Yes Pump Review: Setup, frequency, and cleaning;Milk Storage Initiated by:: RN Raquel Sarna and MPeck (breastmilk storage) Date initiated:: 05/28/19   Consult Status Consult Status: PRN Follow-up type: In-patient    Sandra Palmer 05/28/2019, 4:13 PM

## 2019-05-28 NOTE — Progress Notes (Signed)
Daily Antepartum Note  05/28/2019 Sandra Palmer is a 23 y.o. G1P0101 PPD#1 s/p  SVD/intact perineum @ [redacted]w[redacted]d due to preterm labor  Pregnancy c/b preterm labor 24hr/overnight events:  See delivery note  Subjective:  Meeting all pp goals.   Objective:    Current Vital Signs 24h Vital Sign Ranges  T 98 F (36.7 C) Temp  Avg: 98.3 F (36.8 C)  Min: 98 F (36.7 C)  Max: 98.7 F (37.1 C)  BP (!) 136/92 BP  Min: 105/58  Max: 147/82  HR 68 Pulse  Avg: 69.2  Min: 57  Max: 84  RR 16 Resp  Avg: 18.1  Min: 16  Max: 20  SaO2 98 % Room Air SpO2  Avg: 99.4 %  Min: 98 %  Max: 100 %       24 Hour I/O Current Shift I/O  Time Ins Outs 11/10 0701 - 11/11 0700 In: 1474.8 [P.O.:300; I.V.:1174.8] Out: 400 [Urine:300] No intake/output data recorded.    General: NAD Abdomen: nttp. Firm fundus below the umbilicus Perineum: deferred Skin:  Warm and dry.  Respiratory:   Normal respiratory effort  Medications Current Facility-Administered Medications  Medication Dose Route Frequency Provider Last Rate Last Dose  . acetaminophen (TYLENOL) tablet 650 mg  650 mg Oral Q4H PRN Aletha Halim, MD      . acetaminophen (TYLENOL) tablet 650 mg  650 mg Oral Q4H PRN Ellery, John B, MD      . benzocaine-Menthol (DERMOPLAST) 20-0.5 % topical spray 1 application  1 application Topical PRN Ulla Gallo B, MD      . coconut oil  1 application Topical PRN Lona Millard, MD      . witch hazel-glycerin (TUCKS) pad 1 application  1 application Topical PRN Lona Millard, MD       And  . dibucaine (NUPERCAINAL) 1 % rectal ointment 1 application  1 application Rectal PRN Lona Millard, MD      . diphenhydrAMINE (BENADRYL) capsule 25 mg  25 mg Oral Q6H PRN Ulla Gallo B, MD      . fentaNYL (SUBLIMAZE) injection 50 mcg  50 mcg Intravenous Q1H PRN Aletha Halim, MD      . ibuprofen (ADVIL) tablet 600 mg  600 mg Oral Q6H Lona Millard, MD   600 mg at 05/28/19 0208  . lactated ringers infusion 500-1,000 mL   500-1,000 mL Intravenous PRN Aletha Halim, MD      . lactated ringers infusion   Intravenous Continuous Aletha Halim, MD   Stopped at 05/27/19 2346  . lidocaine (PF) (XYLOCAINE) 1 % injection 30 mL  30 mL Subcutaneous PRN Aletha Halim, MD      . ondansetron (ZOFRAN) injection 4 mg  4 mg Intravenous Q6H PRN Aletha Halim, MD      . ondansetron (ZOFRAN) tablet 4 mg  4 mg Oral Q4H PRN Lona Millard, MD       Or  . ondansetron (ZOFRAN) injection 4 mg  4 mg Intravenous Q4H PRN Lona Millard, MD      . oxytocin (PITOCIN) IV infusion 40 units in NS 1000 mL - Premix  2.5 Units/hr Intravenous Continuous Aletha Halim, MD   Stopped at 05/28/19 0105  . penicillin G potassium 3 Million Units in dextrose 64mL IVPB  3 Million Units Intravenous Jodene Nam, MD 100 mL/hr at 05/27/19 2215 3 Million Units at 05/27/19 2215  . prenatal multivitamin tablet 1 tablet  1 tablet Oral Q1200 Ulla Gallo  B, MD      . Melene Muller ON 05/29/2019] senna-docusate (Senokot-S) tablet 2 tablet  2 tablet Oral Q24H Vania Rea, MD      . simethicone (MYLICON) chewable tablet 80 mg  80 mg Oral PRN Vania Rea, MD      . sodium citrate-citric acid (ORACIT) solution 30 mL  30 mL Oral Q2H PRN [place] Bing, MD      . Melene Muller ON 05/29/2019] Tdap (BOOSTRIX) injection 0.5 mL  0.5 mL Intramuscular Once Vania Rea, MD      . zolpidem (AMBIEN) tablet 5 mg  5 mg Oral QHS PRN Vania Rea, MD        Labs:  Recent Labs  Lab 05/26/19 0027 05/27/19 1307 05/28/19 0526  WBC 12.5* 13.6* 17.3*  HGB 14.1 14.6 12.7  HCT 42.1 43.4 37.9  PLT 235 239 199   Recent Labs  Lab 05/26/19 0027 05/26/19 0651 05/27/19 1307  NA 136 136 135  K 4.0 4.2 3.9  CL 106 106 103  CO2 20* 19* 22  BUN 11 7 5*  CREATININE 1.00 0.95 1.00  GLUCOSE 89 168* 118*  CALCIUM 9.5 8.4* 6.8*    Assessment & Plan:  Pt doing well *Postpartum/postop: routine care. A pos. Likely d/c to home tomorrow.  Cornelia Copa  MD Attending Center for [facility])

## 2019-05-28 NOTE — Lactation Note (Signed)
This note was copied from a baby's chart. Lactation Consultation Note  Patient Name: Sandra Palmer [id number] Date: 05/28/2019 Reason for consult: Initial assessment;Primapara;1st time breastfeeding;NICU baby;Preterm <34wks;Infant < 6lbs  Visited with mom of a 61 hours old pre-term NICU female < 4 lbs. Baby is currently on NPO. Mom is a P1 and she reported (+) breast changes during the pregnancy. She participated in the [facility] program at the [facility] but she wasn't familiar with hand expression. LC showed mom how to hand express, and she was able to easily get colostrum when doing teach back. Noted that mom has very long finger nails though about 2 inches, but she told LC that they're fake and they'll be coming out soon.   Per mom she expects to be discharged tomorrow, LC will get a [facility] referral faxed, but also let mom know about the [facility] loaner in case her discharge is delayed through the weekend; she doesn't have a pump at home. RN Raquel Sarna has already started mom pumping, but mom told LC that "nothing" came out. Explained to mom that the purpose of pumping early on is mainly for breast stimulation and not to get volume, she voiced understanding. Reviewed breastmilk storage guidelines and pumping schedule.  Feeding plan:  1. Encouraged mom to continue pumping every 2-3 hours, at least 8 pumping sessions/24 hours 2. Hand expression was also encouraged  BF brochure, BF resources and NICU booklet were reviewed. Mom reported all questions and concerns were answered, she's aware of [place] OP services and will call PRN.  Maternal Data Formula Feeding for Exclusion: No Has patient been taught Hand Expression?: Yes Does the patient have breastfeeding experience prior to this delivery?: No  Feeding    LATCH Score                   Interventions Interventions: Breast feeding basics reviewed;Breast massage;Hand express;Breast compression;DEBP  Lactation Tools Discussed/Used Tools: Pump Breast  pump type: Double-Electric Breast Pump WIC Program: Yes Pump Review: Setup, frequency, and cleaning;Milk Storage Initiated by:: RN Raquel Sarna and MPeck (breastmilk storage) Date initiated:: 05/28/19   Consult Status Consult Status: PRN Follow-up type: In-patient    Sandra Palmer 05/28/2019, 1:42 PM

## 2019-05-28 NOTE — Telephone Encounter (Signed)
Left message for patient to office to schedule for 1 week postpartum, and BP check. Patient delivered preterm infant.

## 2019-05-28 NOTE — Progress Notes (Signed)
Pt. Set up with breast pump. Settings and frequency reviewed. Explained use of coconut oil. Breast milk storage reviewed.

## 2019-05-29 ENCOUNTER — Encounter: Payer: Medicaid Other | Admitting: Obstetrics & Gynecology

## 2019-05-29 MED ORDER — IBUPROFEN 600 MG PO TABS: 600.0000 mg | ORAL_TABLET | Freq: Four times a day (QID) | ORAL | 0 refills | Status: DC | PRN

## 2019-05-29 MED ORDER — AMLODIPINE BESYLATE 5 MG PO TABS: 5.0000 mg | ORAL_TABLET | Freq: Every day | ORAL | 0 refills | Status: DC

## 2019-05-29 MED ORDER — ACETAMINOPHEN 325 MG PO TABS: 650.0000 mg | ORAL_TABLET | Freq: Four times a day (QID) | ORAL | 0 refills | Status: DC | PRN

## 2019-05-29 MED ORDER — AMLODIPINE BESYLATE 5 MG PO TABS
5.0000 mg | ORAL_TABLET | Freq: Every day | ORAL | Status: DC
  Administered 2019-05-29: 5 mg via ORAL
  Filled 2019-05-29: qty 1

## 2019-05-29 NOTE — Discharge Instructions (Addendum)
Hypertension During Pregnancy Call for any signs of pre-eclampsia or blood pressures consistently above 155/105  Hypertension is also called high blood pressure. High blood pressure means that the force of your blood moving in your body is too strong. It can cause problems for you and your baby. Different types of high blood pressure can happen during pregnancy. The types are:  High blood pressure before you got pregnant. This is called chronic hypertension.  This can continue during your pregnancy. Your doctor will want to keep checking your blood pressure. You may need medicine to keep your blood pressure under control while you are pregnant. You will need follow-up visits after you have your baby.  High blood pressure that goes up during pregnancy when it was normal before. This is called gestational hypertension. It will usually get better after you have your baby, but your doctor will need to watch your blood pressure to make sure that it is getting better.  Very high blood pressure during pregnancy. This is called preeclampsia. Very high blood pressure is an emergency that needs to be checked and treated right away.  You may develop very high blood pressure after giving birth. This is called postpartum preeclampsia. This usually occurs within 48 hours after childbirth but may occur up to 6 weeks after giving birth. This is rare. How does this affect me? If you have high blood pressure during pregnancy, you have a higher chance of developing high blood pressure:  As you get older.  If you get pregnant again. In some cases, high blood pressure during pregnancy can cause:  Stroke.  Heart attack.  Damage to the kidneys, lungs, or liver.  Preeclampsia.  Jerky movements you cannot control (convulsions or seizures).  Problems with the placenta.   What can I do to lower my risk?   Keep a healthy weight.  Eat a healthy diet.  Follow what your doctor tells you about treating any  medical problems that you had before becoming pregnant. It is very important to go to all of your doctor visits. Your doctor will check your blood pressure and make sure that your pregnancy is progressing as it should. Treatment should start early if a problem is found.   Follow these instructions at home: Eating and drinking   Drink enough fluid to keep your pee (urine) pale yellow.  Avoid caffeine. Lifestyle  Do not use any products that contain nicotine or tobacco, such as cigarettes, e-cigarettes, and chewing tobacco. If you need help quitting, ask your doctor.  Do not use alcohol or drugs.  Avoid stress.  Rest and get plenty of sleep.  Regular exercise can help. Ask your doctor what kinds of exercise are best for you. General instructions  Take over-the-counter and prescription medicines only as told by your doctor.  Keep all prenatal and follow-up visits as told by your doctor. This is important. Contact a doctor if:  You have symptoms that your doctor told you to watch for, such as: ? Headaches. ? Nausea. ? Vomiting. ? Belly (abdominal) pain. ? Dizziness. ? Light-headedness. Get help right away if:  You have: ? Very bad belly pain that does not get better with treatment. ? A very bad headache that does not get better. ? Vomiting that does not get better. ? Sudden, fast weight gain. ? Sudden swelling in your hands, ankles, or face. ? Blood in your pee. ? Blurry vision. ? Double vision. ? Shortness of breath. ? Chest pain. ? Weakness on one side  of your body. ? Trouble talking. Summary  High blood pressure is also called hypertension.  High blood pressure means that the force of your blood moving in your body is too strong.  High blood pressure can cause problems for you and your baby.  Keep all follow-up visits as told by your doctor. This is important. This information is not intended to replace advice given to you by your health care provider. Make  sure you discuss any questions you have with your health care provider. Document Released: 08/05/2010 Document Revised: 10/24/2018 Document Reviewed: 07/30/2018 Elsevier Patient Education  2020 [employer].

## 2019-05-29 NOTE — Progress Notes (Signed)
Patient screened out for psychosocial assessment since none of the following apply:  Psychosocial stressors documented in mother or baby's chart  Gestation less than 32 weeks  Code at delivery   Infant with anomalies Please contact the Clinical Social Worker if specific needs arise, by MOB's request, or if MOB scores greater than 9/yes to question 10 on [place] Postpartum Depression Screen.  Ranesha Val, LCSW Clinical Social Worker [facility] Cell#: [phone]     

## 2019-06-03 ENCOUNTER — Telehealth: Payer: Self-pay

## 2019-06-03 NOTE — Telephone Encounter (Signed)
Patient called stating she does not want to take the Amlodipine due to it not begin safe in breast feeding. Patient reports not taking amlodipine since she left the hospital. I have advised patient the provider would not due anything to harm you or your baby. She should not take the advise of a non-professional or use the Internet for medical care. Patient has a follow up blood pressure check on 06/05/2019. I have advised patient to check her blood pressure at home 1-2 per week. She should watch for symptoms of headache that will not go away with  tylenol ,blurred vision, swelling in hands or feet. If she has any of this symptoms she should call us back. Patient advised understanding.

## 2019-06-04 NOTE — MAU Provider Note (Signed)
Please refer to H & P for admission details.    Mane Consolo, MD, FACOG Attending Obstetrician & Gynecologist Faculty Practice, [facility]   

## 2019-06-05 ENCOUNTER — Other Ambulatory Visit: Payer: Self-pay

## 2019-06-05 ENCOUNTER — Ambulatory Visit (INDEPENDENT_AMBULATORY_CARE_PROVIDER_SITE_OTHER): Payer: Medicaid Other | Admitting: *Deleted

## 2019-06-05 VITALS — BP 124/83 | HR 80

## 2019-06-05 DIAGNOSIS — O139 Gestational [pregnancy-induced] hypertension without significant proteinuria, unspecified trimester: Secondary | ICD-10-CM

## 2019-06-05 NOTE — Progress Notes (Signed)
Subjective:  Sandra Palmer is a 23 y.o. female here for BP check.   Hypertension ROS: not taking medications regularly as instructed, no medication side effects noted, no TIA's, no chest pain on exertion, no dyspnea on exertion and no swelling of ankles.    Objective:  LMP 10/10/2018   Appearance alert, well appearing, and in no distress. General exam BP noted to be well controlled today in office.    Assessment:   Blood Pressure well controlled.   Plan:  Pt to follow up as needed and at postpartum visit. Marland Kitchen

## 2019-06-10 NOTE — Progress Notes (Signed)
Patient seen and assessed by nursing staff during this encounter. I have reviewed the chart and agree with the documentation and plan. BP normal  Aletha Halim, MD 06/10/2019 12:20 AM

## 2019-06-16 ENCOUNTER — Ambulatory Visit: Payer: Self-pay

## 2019-06-16 NOTE — Lactation Note (Signed)
This note was copied from a baby's chart. Lactation Consultation Note  Patient Name: Sandra Palmer [id number] Date: 06/16/2019 Reason for consult: Follow-up assessment;Primapara;1st time breastfeeding;NICU baby;Infant < 6lbs;Preterm <34wks  Visited with mom of a 32 week old NICU female < 5 lbs, she's been exclusively BF. NICU RN Sandra Palmer requested a 2 pm feeding assist, when LC got to he room, mom was feeding baby a bottle with breastmilk already but she told LC that baby was still "hungry" and wanted to go to breast. Baby is on gavage feedings and also on bottle feeding with an NFant extra slow nipple.  LC offered assistance with latch and took baby to mother's left breast first in cross cradle position and she was able to latch with very little difficulty, multiple audible swallows heard at some point, but then no swallows, baby would go on and off throughout the feeding and required constant stimulation to continue sucking, even though she would grab the breast easily at first. Baby fed for a total of 12 minutes, after mom burped her she requested assistance with the other breast.  Baby had a hard time latching to the right side, LC had to use a NS $ 20, and then baby was able to have a deep latch. Mom worried that her nipples were too big for baby, but NS # 20 fit slightly "loose" not to fitted; a # 16 would be too tight, reassured mom that her nipples are not too big. Baby required even more stimulation on this feeding to continue sucking and she fed on and off, but colostrum was noted on the shield when baby unlatched briefly and LC checked. Mom took baby back to breast, she was still nursing when exiting the room at the 7 minutes mark.  Mom was concerned about her supply, she's been pumping only 4 times/24 hours and asked LC what else could she do to bring it back. Reviewed pumping schedule, supply and demand and normal newborn behavior for premature babies, she has a DEBP at home.   Feeding  plan:  1. Baby has been put on ad lib feedings this morning per NICU RN. Mom was encouraged to put baby to breast on cues using NS # 20 PRN. NICU staff already reinforced she should be calling them to documented those feedings 2. She'll start pumping again 8 times/24 hours 3. Her first pumping session in the AM will be power pumping  Mom reported all questions and concerns were answered, she's aware of [facility] OP services and will call PRN.  Maternal Data    Feeding Feeding Type: Breast Fed Nipple Type: Dr. Myra Palmer Preemie  LATCH Score Latch: Repeated attempts needed to sustain latch, nipple held in mouth throughout feeding, stimulation needed to elicit sucking reflex.(had to use a NS # 20, baby wouldn't latch without it)  Audible Swallowing: A few with stimulation(some colostrum noted on the shield as well)  Type of Nipple: Everted at rest and after stimulation  Comfort (Breast/Nipple): Soft / non-tender  Hold (Positioning): Assistance needed to correctly position infant at breast and maintain latch.  LATCH Score: 7  Interventions Interventions: Breast feeding basics reviewed;Assisted with latch;Breast massage;Hand express;Breast compression;Adjust position;Support pillows  Lactation Tools Discussed/Used     Consult Status Consult Status: PRN Follow-up type: In-patient    Sandra Palmer 06/16/2019, 2:42 PM

## 2019-06-26 ENCOUNTER — Other Ambulatory Visit: Payer: Self-pay

## 2019-06-26 ENCOUNTER — Ambulatory Visit (INDEPENDENT_AMBULATORY_CARE_PROVIDER_SITE_OTHER): Payer: Medicaid Other | Admitting: Obstetrics and Gynecology

## 2019-06-26 ENCOUNTER — Encounter: Payer: Self-pay | Admitting: Obstetrics and Gynecology

## 2019-06-26 VITALS — BP 124/83 | HR 72 | Wt 140.0 lb

## 2019-06-26 DIAGNOSIS — Z8759 Personal history of other complications of pregnancy, childbirth and the puerperium: Secondary | ICD-10-CM

## 2019-06-26 DIAGNOSIS — Z1389 Encounter for screening for other disorder: Secondary | ICD-10-CM

## 2019-06-26 DIAGNOSIS — O165 Unspecified maternal hypertension, complicating the puerperium: Secondary | ICD-10-CM

## 2019-06-26 HISTORY — DX: Personal history of other complications of pregnancy, childbirth and the puerperium: Z87.59

## 2019-06-26 NOTE — Progress Notes (Signed)
Obstetrics and Gynecology Postpartum Visit  Appointment Date: 06/26/2019  OBGYN Clinic: [facility]  Primary Care Provider: Aletha Halim  Chief Complaint:  Chief Complaint  Patient presents with  . Postpartum Care    History of Present Illness: Sandra Palmer is a 23 y.o. African-American G1P0101 (No LMP recorded.), seen for the above chief complaint. Her past medical history is significant for h/o gHTN, h/o PTB due to PTL at 32wks  She is s/p SVD/IP on 11/11; she was discharged to home on PPD#1  Vaginal bleeding or discharge: No  Breast or formula feeding: both Intercourse: No  Contraception after delivery: abstinence PP depression s/s: No  Any bowel or bladder issues: No  Pap smear: no abnormalities (date: 2019)  Baby came home last week  Review of Systems: as noted in the History of Present Illness.  Medications [name] had no medications administered during this visit. Current Outpatient Medications  Medication Sig Dispense Refill  . Prenatal MV-Min-FA-Omega-3 (PRENATAL GUMMIES/DHA & FA) 0.4-32.5 MG CHEW Chew by mouth.    Marland Kitchen acetaminophen (TYLENOL) 325 MG tablet Take 2 tablets (650 mg total) by mouth every 6 (six) hours as needed (for pain scale < 4). (Patient not taking: Reported on 06/26/2019) 30 tablet 0  . fluticasone (FLONASE) 50 MCG/ACT nasal spray Place 1 spray into both nostrils daily. (Patient not taking: Reported on 06/26/2019) 1 g 2   No current facility-administered medications for this visit.    Allergies Azithromycin  Physical Exam:  BP 124/83   Pulse 72   Wt 140 lb (63.5 kg)   BMI 21.93 kg/m  Body mass index is 21.93 kg/m. General appearance: Well nourished, well developed female in no acute distress.  Respiratory:  Normal respiratory effort Neuro/Psych:  Normal mood and affect.  Skin:  Warm and dry.   Laboratory: none  PP Depression Screening:  EPDS score zero  Assessment: pt doing well  Plan:   D/w pt that may get a period since supplementing with formula but once stops breastfeeding/pumping she should expect a period in about a month or two afterwards  H/o OCPs and depo provera. Not currently interested in anything for [facility]. Pt told to let us know if changes her mind. Rpt pap in 2 years.   RTC PRN  Durene Romans MD Attending Center for [employer])

## 2019-10-26 ENCOUNTER — Other Ambulatory Visit: Payer: Self-pay

## 2019-10-26 ENCOUNTER — Encounter (HOSPITAL_COMMUNITY): Payer: Self-pay | Admitting: Emergency Medicine

## 2019-10-26 ENCOUNTER — Emergency Department (HOSPITAL_COMMUNITY)
Admission: EM | Admit: 2019-10-26 | Discharge: 2019-10-26 | Disposition: A | Discharge status: Home / Self Care | Payer: Medicaid Other | Attending: Emergency Medicine | Admitting: Emergency Medicine

## 2019-10-26 DIAGNOSIS — J339 Nasal polyp, unspecified: Secondary | ICD-10-CM | POA: Insufficient documentation

## 2019-10-26 DIAGNOSIS — Z79899 Other long term (current) drug therapy: Secondary | ICD-10-CM | POA: Insufficient documentation

## 2019-10-26 DIAGNOSIS — R220 Localized swelling, mass and lump, head: Secondary | ICD-10-CM | POA: Diagnosis present

## 2019-10-26 MED ORDER — MUPIROCIN CALCIUM 2 % NA OINT: TOPICAL_OINTMENT | NASAL | 0 refills | Status: DC

## 2019-10-26 MED ORDER — DOXYCYCLINE HYCLATE 100 MG PO TABS
100.0000 mg | ORAL_TABLET | Freq: Once | ORAL | Status: AC
  Administered 2019-10-26: 17:00:00 100 mg via ORAL
  Filled 2019-10-26: qty 1

## 2019-10-26 MED ORDER — DOXYCYCLINE HYCLATE 100 MG PO CAPS: 100.0000 mg | ORAL_CAPSULE | Freq: Two times a day (BID) | ORAL | 0 refills | Status: DC

## 2019-10-26 NOTE — Discharge Instructions (Addendum)
Apply the ointment 3 times a day using a cotton swab and take the antibiotic as directed.  Return here if you develop facial swelling, fever, or persistent vomiting.  Do not take the antibiotic if you are pregnant or breast feeding.

## 2019-10-26 NOTE — ED Triage Notes (Signed)
R side of nose swollen for the last three days   Denies known injury

## 2019-10-28 NOTE — ED Provider Notes (Signed)
[facility] EMERGENCY DEPARTMENT Provider Note   CSN: [id number] Arrival date & time: 10/26/19  1501     History Chief Complaint  Patient presents with  . Facial Swelling    Sandra Palmer is a 24 y.o. female.  HPI      Sandra Palmer is a 24 y.o. female who presents to the Emergency Department complaining of pain and swelling of her right nostril.  Symptoms have been gradually worsening for 3 days.  She describes having tenderness associated with palpation and with blowing her nose.  Symptoms are localized to the right nostril.  She denies known injury, fever, chills, facial pain or swelling, sore throat and difficulty swallowing.  No dental pain or history of dental caries.   Past Medical History:  Diagnosis Date  . Allergy   . Chlamydia 05/18/2015  . History of Papanicolaou smear of cervix 05/18/2015   Nil/Pos  . Preterm delivery 06/26/2019  . Preterm labor 05/26/2019    Patient Active Problem List   Diagnosis Date Noted  . History of gestational hypertension 06/26/2019  . Rash and nonspecific skin eruption 05/08/2019  . Alpha thalassemia silent carrier 01/23/2019  . Allergic rhinitis 11/10/2013    Past Surgical History:  Procedure Laterality Date  . HERNIA REPAIR       OB History    Gravida  1   Para  1   Term  0   Preterm  1   AB  0   Living  1     SAB  0   TAB  0   Ectopic  0   Multiple  0   Live Births  1           No family history on file.  Social History   Tobacco Use  . Smoking status: Never Smoker  . Smokeless tobacco: Never Used  Substance Use Topics  . Alcohol use: Not Currently  . Drug use: No    Home Medications Prior to Admission medications   Medication Sig Start Date End Date Taking? Authorizing Provider  acetaminophen (TYLENOL) 325 MG tablet Take 2 tablets (650 mg total) by mouth every 6 (six) hours as needed (for pain scale < 4). Patient not taking: Reported on 06/26/2019 05/29/19   Aletha Halim, MD    cyclobenzaprine (FLEXERIL) 10 MG tablet Take 1 tablet (10 mg total) by mouth 2 (two) times daily as needed for muscle spasms. Patient not taking: Reported on 05/26/2019 04/20/19   Darlina Rumpf, CNM  doxycycline (VIBRAMYCIN) 100 MG capsule Take 1 capsule (100 mg total) by mouth 2 (two) times daily. 10/26/19   Lejla Moeser, PA-C  fluticasone (FLONASE) 50 MCG/ACT nasal spray Place 1 spray into both nostrils daily. Patient not taking: Reported on 06/26/2019 02/20/19   Emily Filbert, MD  ibuprofen (ADVIL) 600 MG tablet Take 1 tablet (600 mg total) by mouth every 6 (six) hours as needed. Patient not taking: Reported on 06/26/2019 05/29/19   Aletha Halim, MD  mupirocin nasal ointment (BACTROBAN) 2 % Apply inside the affected nostril TID x 10 days using a cotton swab 10/26/19   Shayona Hibbitts, PA-C  Prenatal MV-Min-FA-Omega-3 (PRENATAL GUMMIES/DHA & FA) 0.4-32.5 MG CHEW Chew by mouth.    [provider]    Allergies    Azithromycin  Review of Systems   Review of Systems  Constitutional: Negative for chills and fever.  HENT: Negative for ear pain, facial swelling, sinus pressure, sinus pain, sore throat and trouble swallowing.  Right nostril pain and swelling  Eyes: Negative for visual disturbance.  Gastrointestinal: Negative for nausea and vomiting.  Musculoskeletal: Negative for arthralgias.  Skin: Negative for color change and rash.  Neurological: Negative for dizziness, weakness, numbness and headaches.  Hematological: Negative for adenopathy.  All other systems reviewed and are negative.   Physical Exam Updated Vital Signs BP 131/89 (BP Location: Left Arm)   Pulse 63   Temp 98.6 F (37 C) (Oral)   Resp 16   Ht 5\' 11"  (1.803 m)   Wt 68 kg   LMP 10/26/2019 (Exact Date)   SpO2 100%   BMI 20.92 kg/m   Physical Exam Vitals and nursing note reviewed.  Constitutional:      Appearance: Normal appearance. She is not ill-appearing.  HENT:     Head:  Atraumatic.     Right Ear: Tympanic membrane and ear canal normal.     Left Ear: Tympanic membrane and ear canal normal.     Nose: Nasal tenderness and mucosal edema present. No septal deviation or signs of injury.     Right Nostril: No foreign body or septal hematoma.     Comments: 1 to 2 mm polyp of the right nostril that is tender to palpation.  No erythema of the nose or face.  No facial edema.  No obvious dental tenderness or caries.  No trismus.    Mouth/Throat:     Mouth: Mucous membranes are moist.     Pharynx: Oropharynx is clear. No posterior oropharyngeal erythema.  Cardiovascular:     Rate and Rhythm: Normal rate and regular rhythm.     Pulses: Normal pulses.  Pulmonary:     Effort: Pulmonary effort is normal.     Breath sounds: Normal breath sounds.  Musculoskeletal:        General: Normal range of motion.  Lymphadenopathy:     Cervical: No cervical adenopathy.  Skin:    General: Skin is warm.     Capillary Refill: Capillary refill takes less than 2 seconds.     Findings: No erythema or rash.  Neurological:     General: No focal deficit present.     Mental Status: She is alert.     ED Results / Procedures / Treatments   Labs (all labs ordered are listed, but only abnormal results are displayed) Labs Reviewed - No data to display  EKG None  Radiology No results found.  Procedures Procedures (including critical care time)  Medications Ordered in ED Medications  doxycycline (VIBRA-TABS) tablet 100 mg (100 mg Oral Given 10/26/19 1635)    ED Course  I have reviewed the triage vital signs and the nursing notes.  Pertinent labs & imaging results that were available during my care of the patient were reviewed by me and considered in my medical decision making (see chart for details).    MDM Rules/Calculators/A&P                      Pt appears well and non-toxic. She has a tender, single polyp of the right nostril, no facial involvement or symptoms to  suggest a facial cellulitis. No dental sx's.  Agrees to tx with antibiotics, return precautions discussed.    Final Clinical Impression(s) / ED Diagnoses Final diagnoses:  Nasal swelling    Rx / DC Orders ED Discharge Orders         Ordered    doxycycline (VIBRAMYCIN) 100 MG capsule  2 times daily  10/26/19 1623    mupirocin nasal ointment (BACTROBAN) 2 %     10/26/19 1623           Tamani Durney, [place], PA-C 10/28/19 1330    Long, Arlyss Repress, MD 10/29/19 1351

## 2020-08-05 ENCOUNTER — Encounter: Payer: Self-pay | Admitting: Emergency Medicine

## 2020-08-05 ENCOUNTER — Other Ambulatory Visit: Payer: Self-pay

## 2020-08-05 ENCOUNTER — Ambulatory Visit
Admission: EM | Admit: 2020-08-05 | Discharge: 2020-08-05 | Disposition: A | Discharge status: Home / Self Care | Payer: Medicaid Other | Attending: Emergency Medicine | Admitting: Emergency Medicine

## 2020-08-05 ENCOUNTER — Emergency Department (HOSPITAL_COMMUNITY)
Admission: EM | Admit: 2020-08-05 | Discharge: 2020-08-05 | Disposition: A | Discharge status: Home / Self Care | Payer: Medicaid Other

## 2020-08-05 DIAGNOSIS — B373 Candidiasis of vulva and vagina: Secondary | ICD-10-CM | POA: Diagnosis not present

## 2020-08-05 DIAGNOSIS — Z202 Contact with and (suspected) exposure to infections with a predominantly sexual mode of transmission: Secondary | ICD-10-CM | POA: Insufficient documentation

## 2020-08-05 DIAGNOSIS — B3731 Acute candidiasis of vulva and vagina: Secondary | ICD-10-CM

## 2020-08-05 MED ORDER — FLUCONAZOLE 150 MG PO TABS: 150.0000 mg | ORAL_TABLET | Freq: Once | ORAL | 0 refills | Status: AC

## 2020-08-05 NOTE — Discharge Instructions (Addendum)
Vaginal self-swab obtained.  We will follow up with you regarding abnormal results Prescribed diflucan 150 mg once daily and then second dose 72 hours later Take medications as prescribed and to completion If tests results are positive, please abstain from sexual activity until you and your partner(s) have been treated Follow up with PCP or [facility] if symptoms persists Return here or go to ER if you have any new or worsening symptoms fever, chills, nausea, vomiting, abdominal or pelvic pain, painful intercourse, vaginal discharge, vaginal bleeding, persistent symptoms despite treatment, etc... 

## 2020-08-05 NOTE — ED Triage Notes (Signed)
Vaginal irritation, burning and itching for past 2-3 days

## 2020-08-05 NOTE — ED Provider Notes (Signed)
[facility]   [id number] 08/05/20 Arrival Time: 0847   Chief Complaint  Patient presents with  . Vaginal Itching     SUBJECTIVE:  Sandra Palmer is a 25 y.o. female who presented to the urgent care for complaint of vaginal irritation and itching for the past 2 to 3 days.  She denies a precipitating event, recent sexual encounter or recent antibiotic use.  Patient is sexually active with 1 female partner.  Describes discharge as thick/ white and coating.  Has not tried any OTC medication.  Denies any aggravating factors.  She reports similar symptoms in the past.  She denies fever, chills, nausea, vomiting, abdominal or pelvic pain, urinary symptoms, vaginal bleeding, dyspareunia, vaginal rashes or lesions.   Patient's last menstrual period was 07/12/2020. Current birth control method: Compliant with BC:  ROS: As per HPI.  All other pertinent ROS negative.     Past Medical History:  Diagnosis Date  . Allergy   . Chlamydia 05/18/2015  . History of Papanicolaou smear of cervix 05/18/2015   Nil/Pos  . Preterm delivery 06/26/2019  . Preterm labor 05/26/2019   Past Surgical History:  Procedure Laterality Date  . HERNIA REPAIR     Allergies  Allergen Reactions  . Azithromycin Swelling and Rash   No current facility-administered medications on file prior to encounter.   Current Outpatient Medications on File Prior to Encounter  Medication Sig Dispense Refill  . acetaminophen (TYLENOL) 325 MG tablet Take 2 tablets (650 mg total) by mouth every 6 (six) hours as needed (for pain scale < 4). (Patient not taking: Reported on 06/26/2019) 30 tablet 0  . cyclobenzaprine (FLEXERIL) 10 MG tablet Take 1 tablet (10 mg total) by mouth 2 (two) times daily as needed for muscle spasms. (Patient not taking: Reported on 05/26/2019) 20 tablet 0  . doxycycline (VIBRAMYCIN) 100 MG capsule Take 1 capsule (100 mg total) by mouth 2 (two) times daily. 20 capsule 0  . fluticasone (FLONASE) 50  MCG/ACT nasal spray Place 1 spray into both nostrils daily. (Patient not taking: Reported on 06/26/2019) 1 g 2  . ibuprofen (ADVIL) 600 MG tablet Take 1 tablet (600 mg total) by mouth every 6 (six) hours as needed. (Patient not taking: Reported on 06/26/2019) 30 tablet 0  . mupirocin nasal ointment (BACTROBAN) 2 % Apply inside the affected nostril TID x 10 days using a cotton swab 22 g 0  . Prenatal MV-Min-FA-Omega-3 (PRENATAL GUMMIES/DHA & FA) 0.4-32.5 MG CHEW Chew by mouth.      Social History   Socioeconomic History  . Marital status: Single    Spouse name: Not on file  . Number of children: Not on file  . Years of education: Not on file  . Highest education level: Not on file  Occupational History  . Not on file  Tobacco Use  . Smoking status: Never Smoker  . Smokeless tobacco: Never Used  Vaping Use  . Vaping Use: Never used  Substance and Sexual Activity  . Alcohol use: Not Currently  . Drug use: No  . Sexual activity: Yes    Partners: Male    Birth control/protection: None  Other Topics Concern  . Not on file  Social History Narrative   Lives with mother   Social Determinants of Health   Financial Resource Strain: Not on file  Food Insecurity: Not on file  Transportation Needs: Not on file  Physical Activity: Not on file  Stress: Not on file  Social Connections: Not on  file  Intimate Partner Violence: Not on file   No family history on file.  OBJECTIVE:  Vitals:   08/05/20 0910 08/05/20 0915  BP:  113/75  Pulse:  79  Resp:  17  Temp:  97.8 F (36.6 C)  TempSrc:  Oral  SpO2:  96%  Weight: 149 lb 14.6 oz (68 kg)   Height: 5\' 11"  (1.803 m)      General appearance: Alert, NAD, appears stated age Head: NCAT Throat: lips, mucosa, and tongue normal; teeth and gums normal Lungs: CTA bilaterally without adventitious breath sounds Heart: regular rate and rhythm.  Radial pulses 2+ symmetrical bilaterally Back: no CVA tenderness Abdomen: soft, non-tender;  bowel sounds normal; no masses or organomegaly; no guarding or rebound tenderness GU:  Deferred; Cervical self swab obtained Skin: warm and dry Psychological:  Alert and cooperative. Normal mood and affect.  LABS:  Results for orders placed or performed during the hospital encounter of 05/26/19  Culture, beta strep (group b only)   Specimen: Vaginal/Rectal; Genital  Result Value Ref Range   Specimen Description VAGINAL/RECTAL    Special Requests NONE    Culture      NO GROUP B STREP (S.AGALACTIAE) ISOLATED Performed at [facility], [address] [place], [place] [id number]    Report Status 05/28/2019 FINAL   Wet prep, genital  Result Value Ref Range   Yeast Wet Prep HPF POC NONE SEEN NONE SEEN   Trich, Wet Prep NONE SEEN NONE SEEN   Clue Cells Wet Prep HPF POC NONE SEEN NONE SEEN   WBC, Wet Prep HPF POC FEW (A) NONE SEEN   Sperm NONE SEEN   SARS Coronavirus 2 by RT PCR (hospital order, performed in [facility] hospital lab) Nasopharyngeal Nasopharyngeal Swab   Specimen: Nasopharyngeal Swab  Result Value Ref Range   SARS Coronavirus 2 NEGATIVE NEGATIVE  Urinalysis, Routine w reflex microscopic  Result Value Ref Range   Color, Urine YELLOW YELLOW   APPearance HAZY (A) CLEAR   Specific Gravity, Urine 1.019 1.005 - 1.030   pH 6.0 5.0 - 8.0   Glucose, UA NEGATIVE NEGATIVE mg/dL   Hgb urine dipstick NEGATIVE NEGATIVE   Bilirubin Urine NEGATIVE NEGATIVE   Ketones, ur 5 (A) NEGATIVE mg/dL   Protein, ur NEGATIVE NEGATIVE mg/dL   Nitrite NEGATIVE NEGATIVE   Leukocytes,Ua NEGATIVE NEGATIVE  CBC  Result Value Ref Range   WBC 12.5 (H) 4.0 - 10.5 K/uL   RBC 4.74 3.87 - 5.11 MIL/uL   Hemoglobin 14.1 12.0 - 15.0 g/dL   HCT 40.942.1 81.136.0 - 91.446.0 %   MCV 88.8 80.0 - 100.0 fL   MCH 29.7 26.0 - 34.0 pg   MCHC 33.5 30.0 - 36.0 g/dL   RDW 78.213.3 95.611.5 - 21.315.5 %   Platelets 235 150 - 400 K/uL   nRBC 0.0 0.0 - 0.2 %  RPR  Result Value Ref Range   RPR Ser Ql NON REACTIVE NON REACTIVE   Comprehensive metabolic panel  Result Value Ref Range   Sodium 136 135 - 145 mmol/L   Potassium 4.0 3.5 - 5.1 mmol/L   Chloride 106 98 - 111 mmol/L   CO2 20 (L) 22 - 32 mmol/L   Glucose, Bld 89 70 - 99 mg/dL   BUN 11 6 - 20 mg/dL   Creatinine, Ser 0.861.00 0.44 - 1.00 mg/dL   Calcium 9.5 8.9 - 57.810.3 mg/dL   Total Protein 6.4 (L) 6.5 - 8.1 g/dL   Albumin 3.5  3.5 - 5.0 g/dL   AST 18 15 - 41 U/L   ALT 14 0 - 44 U/L   Alkaline Phosphatase 98 38 - 126 U/L   Total Bilirubin 0.3 0.3 - 1.2 mg/dL   GFR calc non Af Amer >60 >60 mL/min   GFR calc Af Amer >60 >60 mL/min   Anion gap 10 5 - 15  Protein / creatinine ratio, urine  Result Value Ref Range   Creatinine, Urine 110.70 mg/dL   Total Protein, Urine 10 mg/dL   Protein Creatinine Ratio 0.09 0.00 - 0.15 mg/mg[Cre]  Basic metabolic panel  Result Value Ref Range   Sodium 136 135 - 145 mmol/L   Potassium 4.2 3.5 - 5.1 mmol/L   Chloride 106 98 - 111 mmol/L   CO2 19 (L) 22 - 32 mmol/L   Glucose, Bld 168 (H) 70 - 99 mg/dL   BUN 7 6 - 20 mg/dL   Creatinine, Ser 4.09 0.44 - 1.00 mg/dL   Calcium 8.4 (L) 8.9 - 10.3 mg/dL   GFR calc non Af Amer >60 >60 mL/min   GFR calc Af Amer >60 >60 mL/min   Anion gap 11 5 - 15  CBC  Result Value Ref Range   WBC 13.6 (H) 4.0 - 10.5 K/uL   RBC 4.94 3.87 - 5.11 MIL/uL   Hemoglobin 14.6 12.0 - 15.0 g/dL   HCT 81.1 91.4 - 78.2 %   MCV 87.9 80.0 - 100.0 fL   MCH 29.6 26.0 - 34.0 pg   MCHC 33.6 30.0 - 36.0 g/dL   RDW 95.6 21.3 - 08.6 %   Platelets 239 150 - 400 K/uL   nRBC 0.0 0.0 - 0.2 %  Comprehensive metabolic panel  Result Value Ref Range   Sodium 135 135 - 145 mmol/L   Potassium 3.9 3.5 - 5.1 mmol/L   Chloride 103 98 - 111 mmol/L   CO2 22 22 - 32 mmol/L   Glucose, Bld 118 (H) 70 - 99 mg/dL   BUN 5 (L) 6 - 20 mg/dL   Creatinine, Ser 5.78 0.44 - 1.00 mg/dL   Calcium 6.8 (L) 8.9 - 10.3 mg/dL   Total Protein 7.5 6.5 - 8.1 g/dL   Albumin 3.6 3.5 - 5.0 g/dL   AST 23 15 - 41 U/L   ALT 18 0 - 44 U/L    Alkaline Phosphatase 118 38 - 126 U/L   Total Bilirubin 0.5 0.3 - 1.2 mg/dL   GFR calc non Af Amer >60 >60 mL/min   GFR calc Af Amer >60 >60 mL/min   Anion gap 10 5 - 15  Glucose, capillary  Result Value Ref Range   Glucose-Capillary 128 (H) 70 - 99 mg/dL  Glucose, capillary  Result Value Ref Range   Glucose-Capillary 101 (H) 70 - 99 mg/dL  Glucose, capillary  Result Value Ref Range   Glucose-Capillary 92 70 - 99 mg/dL  CBC  Result Value Ref Range   WBC 17.3 (H) 4.0 - 10.5 K/uL   RBC 4.28 3.87 - 5.11 MIL/uL   Hemoglobin 12.7 12.0 - 15.0 g/dL   HCT 46.9 62.9 - 52.8 %   MCV 88.6 80.0 - 100.0 fL   MCH 29.7 26.0 - 34.0 pg   MCHC 33.5 30.0 - 36.0 g/dL   RDW 41.3 24.4 - 01.0 %   Platelets 199 150 - 400 K/uL   nRBC 0.0 0.0 - 0.2 %  Type and screen [facility]  Result Value Ref Range  ABO/RH(D) A POS    Antibody Screen NEG    Sample Expiration      05/29/2019,2359 Performed at [facility], [address] [place], [place] [address]   ABO/Rh  Result Value Ref Range   ABO/RH(D)      A POS Performed at [facility], [address], [place], [place] [address]     Labs Reviewed  CERVICOVAGINAL ANCILLARY ONLY    ASSESSMENT & PLAN:  1. STD exposure   2. Vaginal yeast infection     Meds ordered this encounter  Medications  . fluconazole (DIFLUCAN) 150 MG tablet    Sig: Take 1 tablet (150 mg total) by mouth once for 1 dose. May take the second dose 72 hours after the first if symptom does not resolve    Dispense:  2 tablet    Refill:  0    Pending: Labs Reviewed  CERVICOVAGINAL ANCILLARY ONLY    Discharge Instructions  Vaginal self-swab obtained.  We will follow up with you regarding abnormal results Prescribed diflucan 150 mg once daily and then second dose 72 hours later Take medications as prescribed and to completion If tests results are positive, please abstain from sexual activity until you and your partner(s) have been  treated Follow up with PCP or [facility] if symptoms persists Return here or go to ER if you have any new or worsening symptoms fever, chills, nausea, vomiting, abdominal or pelvic pain, painful intercourse, vaginal discharge, vaginal bleeding, persistent symptoms despite treatment, etc...  Reviewed expectations re: course of current medical issues. Questions answered. Outlined signs and symptoms indicating need for more acute intervention. Patient verbalized understanding. After Visit Summary given.       Durward Parcel, FNP 08/05/20 [phone]

## 2020-08-06 ENCOUNTER — Telehealth (HOSPITAL_COMMUNITY): Payer: Self-pay | Admitting: Emergency Medicine

## 2020-08-06 LAB — CERVICOVAGINAL ANCILLARY ONLY
Bacterial Vaginitis (gardnerella): POSITIVE — AB
Candida Glabrata: NEGATIVE
Candida Vaginitis: POSITIVE — AB
Chlamydia: NEGATIVE
Comment: NEGATIVE
Comment: NEGATIVE
Comment: NEGATIVE
Comment: NEGATIVE
Comment: NEGATIVE
Comment: NORMAL
Neisseria Gonorrhea: NEGATIVE
Trichomonas: NEGATIVE

## 2020-08-06 MED ORDER — METRONIDAZOLE 500 MG PO TABS: 500.0000 mg | ORAL_TABLET | Freq: Two times a day (BID) | ORAL | 0 refills | Status: DC

## 2020-08-06 NOTE — Telephone Encounter (Signed)
Patient wanted prescription sent to a different pharmacy 

## 2020-10-19 ENCOUNTER — Encounter: Payer: No Typology Code available for payment source | Admitting: Obstetrics & Gynecology

## 2020-10-29 ENCOUNTER — Other Ambulatory Visit: Payer: Self-pay

## 2020-10-29 ENCOUNTER — Ambulatory Visit
Admission: EM | Admit: 2020-10-29 | Discharge: 2020-10-29 | Disposition: A | Discharge status: Home / Self Care | Payer: Medicaid Other | Attending: Family Medicine | Admitting: Family Medicine

## 2020-10-29 DIAGNOSIS — N760 Acute vaginitis: Secondary | ICD-10-CM | POA: Diagnosis not present

## 2020-10-29 MED ORDER — METRONIDAZOLE 500 MG PO TABS: 500.0000 mg | ORAL_TABLET | Freq: Two times a day (BID) | ORAL | 1 refills | Status: DC

## 2020-10-29 MED ORDER — FLUCONAZOLE 150 MG PO TABS: ORAL_TABLET | ORAL | 1 refills | Status: DC

## 2020-10-29 NOTE — ED Provider Notes (Signed)
[facility]   [id number] 10/29/20 Arrival Time: 1626   CC: VAGINAL DISCHARGE  SUBJECTIVE:  Sandra Palmer is a 25 y.o. female who presents with complaints of gradual vaginal discharge that began about a week ago. Reports recent abortion within the last month. Has hx BV and yeast in 07/2020. Patient is sexually active with one female partnet. Describes discharge as thick and white. Has not attempted OTC treatment. Reports worsening symptoms with urination. Reports that seh wears tight clothes and thongs for work and thinks that this may be contributing to her symptoms. Denies fever, chills, nausea, vomiting, abdominal or pelvic pain, urinary symptoms, vaginal odor, vaginal bleeding, dyspareunia, vaginal rashes or lesions.   No LMP recorded. (Menstrual status: Other).  ROS: As per HPI.  All other pertinent ROS negative.     Past Medical History:  Diagnosis Date  . Allergy   . Chlamydia 05/18/2015  . History of Papanicolaou smear of cervix 05/18/2015   Nil/Pos  . Preterm delivery 06/26/2019  . Preterm labor 05/26/2019   Past Surgical History:  Procedure Laterality Date  . HERNIA REPAIR     Allergies  Allergen Reactions  . Azithromycin Swelling and Rash   No current facility-administered medications on file prior to encounter.   Current Outpatient Medications on File Prior to Encounter  Medication Sig Dispense Refill  . acetaminophen (TYLENOL) 325 MG tablet Take 2 tablets (650 mg total) by mouth every 6 (six) hours as needed (for pain scale < 4). (Patient not taking: Reported on 06/26/2019) 30 tablet 0  . cyclobenzaprine (FLEXERIL) 10 MG tablet Take 1 tablet (10 mg total) by mouth 2 (two) times daily as needed for muscle spasms. (Patient not taking: Reported on 05/26/2019) 20 tablet 0  . doxycycline (VIBRAMYCIN) 100 MG capsule Take 1 capsule (100 mg total) by mouth 2 (two) times daily. 20 capsule 0  . fluticasone (FLONASE) 50 MCG/ACT nasal spray Place 1 spray into both  nostrils daily. (Patient not taking: Reported on 06/26/2019) 1 g 2  . ibuprofen (ADVIL) 600 MG tablet Take 1 tablet (600 mg total) by mouth every 6 (six) hours as needed. (Patient not taking: Reported on 06/26/2019) 30 tablet 0  . mupirocin nasal ointment (BACTROBAN) 2 % Apply inside the affected nostril TID x 10 days using a cotton swab 22 g 0  . Prenatal MV-Min-FA-Omega-3 (PRENATAL GUMMIES/DHA & FA) 0.4-32.5 MG CHEW Chew by mouth.      Social History   Socioeconomic History  . Marital status: Single    Spouse name: Not on file  . Number of children: Not on file  . Years of education: Not on file  . Highest education level: Not on file  Occupational History  . Not on file  Tobacco Use  . Smoking status: Never Smoker  . Smokeless tobacco: Never Used  Vaping Use  . Vaping Use: Never used  Substance and Sexual Activity  . Alcohol use: Not Currently  . Drug use: No  . Sexual activity: Yes    Partners: Male    Birth control/protection: None  Other Topics Concern  . Not on file  Social History Narrative   Lives with mother   Social Determinants of Health   Financial Resource Strain: Not on file  Food Insecurity: Not on file  Transportation Needs: Not on file  Physical Activity: Not on file  Stress: Not on file  Social Connections: Not on file  Intimate Partner Violence: Not on file   No family history on file.  OBJECTIVE:  Vitals:   10/29/20 1634  BP: 111/73  Pulse: 80  Resp: 16  Temp: 97.8 F (36.6 C)  TempSrc: Oral  SpO2: 98%     General appearance: Alert, NAD, appears stated age Head: NCAT Throat: lips, mucosa, and tongue normal; teeth and gums normal Lungs: CTA bilaterally without adventitious breath sounds Heart: regular rate and rhythm.  Radial pulses 2+ symmetrical bilaterally Back: no CVA tenderness Abdomen: soft, non-tender; bowel sounds normal; no masses or organomegaly; no guarding or rebound tenderness GU: declines  Skin: warm and  dry Psychological:  Alert and cooperative. Normal mood and affect.  LABS:  Results for orders placed or performed during the hospital encounter of 08/05/20  Cervicovaginal ancillary only  Result Value Ref Range   Neisseria Gonorrhea Negative    Chlamydia Negative    Trichomonas Negative    Bacterial Vaginitis (gardnerella) Positive (A)    Candida Vaginitis Positive (A)    Candida Glabrata Negative    Comment      Normal Reference Range Bacterial Vaginosis - Negative   Comment Normal Reference Range Candida Species - Negative    Comment Normal Reference Range Candida Galbrata - Negative    Comment Normal Reference Range Trichomonas - Negative    Comment Normal Reference Ranger Chlamydia - Negative    Comment      Normal Reference Range Neisseria Gonorrhea - Negative    Labs Reviewed - No data to display  ASSESSMENT & PLAN:  1. Acute vaginitis     Meds ordered this encounter  Medications  . fluconazole (DIFLUCAN) 150 MG tablet    Sig: Take one tablet at the onset of symptoms. If symptoms are still present 3 days later, take the second tablet.    Dispense:  2 tablet    Refill:  1    Order Specific Question:   Supervising Provider    Answer:   Merrilee Jansky [id number]  . metroNIDAZOLE (FLAGYL) 500 MG tablet    Sig: Take 1 tablet (500 mg total) by mouth 2 (two) times daily.    Dispense:  14 tablet    Refill:  1    Order Specific Question:   Supervising Provider    Answer:   Merrilee Jansky [[id number]]   Given history and symptoms will treat today with: Prescribed metronidazole 500 mg twice daily for 7 days (do not take while consuming alcohol and/or if breastfeeding) Prescribed diflucan 150 mg once daily and then second dose 72 hours later Take medications as prescribed and to completion If tests results are positive, please abstain from sexual activity until you and your partner(s) have been treated Follow up with PCP or [facility] if symptoms persists Return  here or go to ER if you have any new or worsening symptoms fever, chills, nausea, vomiting, abdominal or pelvic pain, painful intercourse, persistent symptoms despite treatment Reviewed expectations re: course of current medical issues. Questions answered. Outlined signs and symptoms indicating need for more acute intervention. Patient verbalized understanding. After Visit Summary given.       Moshe Cipro, NP 10/29/20 1658

## 2020-10-29 NOTE — ED Triage Notes (Signed)
Pt presents today with c/o of vaginal burning with white drainage x 2 days. She reports having an abortion one month ago and has not been having sex since then.

## 2020-10-29 NOTE — Discharge Instructions (Addendum)
I have sent in Metronidazole for you to take twice a day for 7 days.  I have sent in fluconazole in case of yeast. Take one tablet at the onset of symptoms. If symptoms are still present in 3 days, take the second tablet.   Follow up with this office or with primary care if symptoms are persisting.  Follow up in the ER for high fever, trouble swallowing, trouble breathing, other concerning symptoms.  Do not drink alcohol with this medication, it will make you very sick.  May use Boric Acid suppositories daily for 30 days. Then may use 2-3 times per week for maintenance.  Follow up with PCP or GYN as needed.

## 2021-05-17 HISTORY — PX: DILATION AND CURETTAGE OF UTERUS: SHX78

## 2021-08-06 ENCOUNTER — Ambulatory Visit
Admission: EM | Admit: 2021-08-06 | Discharge: 2021-08-06 | Disposition: A | Discharge status: Home / Self Care | Payer: Medicaid Other | Attending: Family Medicine | Admitting: Family Medicine

## 2021-08-06 ENCOUNTER — Other Ambulatory Visit: Payer: Self-pay

## 2021-08-06 ENCOUNTER — Encounter: Payer: Self-pay | Admitting: Emergency Medicine

## 2021-08-06 DIAGNOSIS — N939 Abnormal uterine and vaginal bleeding, unspecified: Secondary | ICD-10-CM | POA: Insufficient documentation

## 2021-08-06 DIAGNOSIS — Z113 Encounter for screening for infections with a predominantly sexual mode of transmission: Secondary | ICD-10-CM | POA: Diagnosis not present

## 2021-08-06 LAB — POCT URINALYSIS DIP (MANUAL ENTRY)
Bilirubin, UA: NEGATIVE
Glucose, UA: NEGATIVE mg/dL
Ketones, POC UA: NEGATIVE mg/dL
Leukocytes, UA: NEGATIVE
Nitrite, UA: NEGATIVE
Spec Grav, UA: >=1.03 — AB (ref 1.010–1.025)
Urobilinogen, UA: 0.2 E.U./dL
pH, UA: 5 (ref 5.0–8.0)

## 2021-08-06 LAB — POCT URINE PREGNANCY: Preg Test, Ur: NEGATIVE

## 2021-08-06 NOTE — ED Triage Notes (Signed)
Pt was bleeding for a couple of days and noticed a large blood clot today.

## 2021-08-06 NOTE — Discharge Instructions (Addendum)
If any severe bleeding occurs or severe abdominal or pelvic pain, go immediately to the Emergency Department.   STI cytology result normally result within 48 hours.If any treatment is needed our office will contact you pertaining to any abnormal results.

## 2021-08-06 NOTE — ED Provider Notes (Signed)
Sandra Palmer    CSN: [id number] Arrival date & time: 08/06/21  1340      History   Chief Complaint Chief Complaint  Patient presents with   vaginal Bleeding    HPI Sandra Palmer is a 26 y.o. female.   HPI Patient presents today for evaluation of abnormal vaginal bleeding.  Past and abnormally appearing blood clot earlier this morning.  Patient reports a recent history of an elective abortion in November.  She reports since having the abortion she has not returned to her normal menstrual cycle.  Abortion was completed pharmacologically.  She reports she did follow-up for evaluation following the procedure.  She reports scant bleeding intermittently over the last few days.  No known exposure to any STDs.  However she would like STD testing.  She is concerned she may be pregnant however did not take a urine pregnancy test.  Reports earlier in the week having some breast tenderness and increased appetite but at present not currently having the symptoms.  No fever, nausea, or vomiting.   Past Medical History:  Diagnosis Date   Allergy    Chlamydia 05/18/2015   History of Papanicolaou smear of cervix 05/18/2015   Nil/Pos   Preterm delivery 06/26/2019   Preterm labor 05/26/2019    Patient Active Problem List   Diagnosis Date Noted   History of gestational hypertension 06/26/2019   Rash and nonspecific skin eruption 05/08/2019   Alpha thalassemia silent carrier 01/23/2019   Allergic rhinitis 11/10/2013    Past Surgical History:  Procedure Laterality Date   HERNIA REPAIR      OB History     Gravida  1   Para  1   Term  0   Preterm  1   AB  0   Living  1      SAB  0   IAB  0   Ectopic  0   Multiple  0   Live Births  1            Home Medications    Prior to Admission medications   Medication Sig Start Date End Date Taking? Authorizing Provider  acetaminophen (TYLENOL) 325 MG tablet Take 2 tablets (650 mg total) by mouth every 6 (six) hours  as needed (for pain scale < 4). Patient not taking: Reported on 06/26/2019 05/29/19   [place] Bing, MD  cyclobenzaprine (FLEXERIL) 10 MG tablet Take 1 tablet (10 mg total) by mouth 2 (two) times daily as needed for muscle spasms. Patient not taking: Reported on 05/26/2019 04/20/19   Calvert Cantor, CNM  doxycycline (VIBRAMYCIN) 100 MG capsule Take 1 capsule (100 mg total) by mouth 2 (two) times daily. 10/26/19   Triplett, Tammy, PA-C  fluconazole (DIFLUCAN) 150 MG tablet Take one tablet at the onset of symptoms. If symptoms are still present 3 days later, take the second tablet. 10/29/20   Moshe Cipro, NP  fluticasone (FLONASE) 50 MCG/ACT nasal spray Place 1 spray into both nostrils daily. Patient not taking: Reported on 06/26/2019 02/20/19   Allie Bossier, MD  ibuprofen (ADVIL) 600 MG tablet Take 1 tablet (600 mg total) by mouth every 6 (six) hours as needed. Patient not taking: Reported on 06/26/2019 05/29/19   [place] Bing, MD  metroNIDAZOLE (FLAGYL) 500 MG tablet Take 1 tablet (500 mg total) by mouth 2 (two) times daily. 10/29/20   Moshe Cipro, NP  mupirocin nasal ointment (BACTROBAN) 2 % Apply inside the affected nostril TID x 10 days  using a cotton swab 10/26/19   Triplett, Tammy, PA-C  Prenatal MV-Min-FA-Omega-3 (PRENATAL GUMMIES/DHA & FA) 0.4-32.5 MG CHEW Chew by mouth.    [provider]    Family History No family history on file.  Social History Social History   Tobacco Use   Smoking status: Never   Smokeless tobacco: Never  Vaping Use   Vaping Use: Some days  Substance Use Topics   Alcohol use: Not Currently   Drug use: No     Allergies   Azithromycin   Review of Systems Review of Systems Pertinent negatives listed in HPI  Physical Exam Triage Vital Signs ED Triage Vitals  Enc Vitals Group     BP 08/06/21 1501 123/82     Pulse Rate 08/06/21 1501 67     Resp 08/06/21 1501 18     Temp 08/06/21 1501 97.9 F (36.6 C)     Temp  Source 08/06/21 1501 Oral     SpO2 08/06/21 1501 97 %     Weight --      Height --      Head Circumference --      Peak Flow --      Pain Score 08/06/21 1456 0     Pain Loc --      Pain Edu? --      Excl. in [place]? --    No data found.  Updated Vital Signs BP 123/82 (BP Location: Left Arm)    Pulse 67    Temp 97.9 F (36.6 C) (Oral)    Resp 18    LMP  (LMP Unknown)    SpO2 97%    Breastfeeding No   Visual Acuity Right Eye Distance:   Left Eye Distance:   Bilateral Distance:    Right Eye Near:   Left Eye Near:    Bilateral Near:     Physical Exam Constitutional:      Appearance: Normal appearance.  HENT:     Head: Normocephalic and atraumatic.  Cardiovascular:     Rate and Rhythm: Normal rate and regular rhythm.  Pulmonary:     Effort: Pulmonary effort is normal.     Breath sounds: Normal breath sounds.  Abdominal:     General: Abdomen is flat. There is no distension.     Tenderness: There is no abdominal tenderness. There is no right CVA tenderness, left CVA tenderness or guarding.  Skin:    General: Skin is warm and dry.     Capillary Refill: Capillary refill takes less than 2 seconds.  Neurological:     General: No focal deficit present.     Mental Status: She is alert and oriented to person, place, and time.  Psychiatric:        Mood and Affect: Mood normal.        Behavior: Behavior normal.        Thought Content: Thought content normal.        Judgment: Judgment normal.   Vaginal cytology self collected   UC Treatments / Results  Labs (all labs ordered are listed, but only abnormal results are displayed) Labs Reviewed  POCT URINALYSIS DIP (MANUAL ENTRY) - Abnormal; Notable for the following components:      Result Value   Spec Grav, UA >=1.030 (*)    Blood, UA large (*)    Protein Ur, POC trace (*)    All other components within normal limits  HCG, QUANTITATIVE, PREGNANCY  HIV ANTIBODY (ROUTINE TESTING W REFLEX)  RPR  POCT URINE PREGNANCY     EKG   Radiology No results found.  Procedures Procedures (including critical care time)  Medications Ordered in UC Medications - No data to display  Initial Impression / Assessment and Plan / UC Course  I have reviewed the triage vital signs and the nursing notes.  Pertinent labs & imaging results that were available during my care of the patient were reviewed by me and considered in my medical decision making (see chart for details).    Menstrual bleeding secondary to recent elective abortion, urine hCG negative, quantitative hCG pending.  Vaginal cytology pending.  RPR/HIV. UA unremarkable.  Patient given strict ER precautions if she begins to persistently pass large clots or begins to heavily bleed by soaking 1 pad within an hour. Explained that her body/hormonal level is adjusting to recent aborted pregnancy and it may take a few weeks for her cycle to regulate.  Patient has an appointment scheduled with her primary care provider for a full GYN and well woman exam the first week of February.  Patient advised we will notify her of any abnormal results.  She verbalized understanding and agreement with today's plan. Final Clinical Impressions(s) / UC Diagnoses   Final diagnoses:  Abnormal bleeding in menstrual cycle  Routine screening for STI (sexually transmitted infection)     Discharge Instructions      If any severe bleeding occurs or severe abdominal or pelvic pain, go immediately to the Emergency Department.  Your blood work will result within 3-4 days. STI result normally result within 48 hours.   ED Prescriptions   None    PDMP not reviewed this encounter.   Bing Neighbors, [place] 08/07/21 [phone]

## 2021-08-08 LAB — CERVICOVAGINAL ANCILLARY ONLY
Bacterial Vaginitis (gardnerella): POSITIVE — AB
Candida Glabrata: NEGATIVE
Candida Vaginitis: NEGATIVE
Chlamydia: NEGATIVE
Comment: NEGATIVE
Comment: NEGATIVE
Comment: NEGATIVE
Comment: NEGATIVE
Comment: NEGATIVE
Comment: NORMAL
Neisseria Gonorrhea: NEGATIVE
Trichomonas: NEGATIVE

## 2021-08-09 ENCOUNTER — Telehealth (HOSPITAL_COMMUNITY): Payer: Self-pay | Admitting: Emergency Medicine

## 2021-08-09 MED ORDER — METRONIDAZOLE 500 MG PO TABS: 500.0000 mg | ORAL_TABLET | Freq: Two times a day (BID) | ORAL | 0 refills | Status: DC

## 2021-08-23 ENCOUNTER — Ambulatory Visit: Payer: Medicaid Other | Admitting: Obstetrics and Gynecology

## 2021-09-29 ENCOUNTER — Other Ambulatory Visit (HOSPITAL_COMMUNITY)
Admission: RE | Admit: 2021-09-29 | Discharge: 2021-09-29 | Disposition: A | Discharge status: Home / Self Care | Payer: Medicaid Other | Source: Ambulatory Visit | Attending: Obstetrics and Gynecology | Admitting: Obstetrics and Gynecology

## 2021-09-29 ENCOUNTER — Encounter: Payer: Self-pay | Admitting: Obstetrics and Gynecology

## 2021-09-29 ENCOUNTER — Ambulatory Visit (INDEPENDENT_AMBULATORY_CARE_PROVIDER_SITE_OTHER): Payer: Medicaid Other | Admitting: Obstetrics and Gynecology

## 2021-09-29 ENCOUNTER — Other Ambulatory Visit: Payer: Self-pay

## 2021-09-29 VITALS — BP 132/82 | HR 79 | Ht 69.0 in | Wt 149.2 lb

## 2021-09-29 DIAGNOSIS — Z30016 Encounter for initial prescription of transdermal patch hormonal contraceptive device: Secondary | ICD-10-CM

## 2021-09-29 DIAGNOSIS — Z124 Encounter for screening for malignant neoplasm of cervix: Secondary | ICD-10-CM

## 2021-09-29 DIAGNOSIS — Z01419 Encounter for gynecological examination (general) (routine) without abnormal findings: Secondary | ICD-10-CM | POA: Insufficient documentation

## 2021-09-29 DIAGNOSIS — Z113 Encounter for screening for infections with a predominantly sexual mode of transmission: Secondary | ICD-10-CM

## 2021-09-29 MED ORDER — TWIRLA 120-30 MCG/24HR TD PTWK: 1.0000 | MEDICATED_PATCH | TRANSDERMAL | 3 refills | Status: DC

## 2021-09-29 NOTE — Progress Notes (Signed)
?Obstetrics and Gynecology ?Annual Patient Evaluation ? ?Appointment Date: 09/29/2021 ? ?OBGYN Clinic: [facility]  ? ?Primary Care Provider: None ? ?Chief Complaint:  ?Chief Complaint  ?Patient presents with  ? Gynecologic Exam  ? ? ?History of Present Illness: Sandra Palmer is a 26 y.o. [phone] (Patient's last menstrual period was 09/10/2021 (exact date).), seen for the above chief complaint. Her past medical history is significant for h/o gHTN, h/o PTB, recent surgical AB  ? ?Right labial bump and patient wondering if ingrown. She has had these before. +tight fitting undergarments recently ? ?Review of Systems: Pertinent items noted in HPI and remainder of comprehensive ROS otherwise negative.  ? ?Past Medical History:  ?Past Medical History:  ?Diagnosis Date  ? Allergic rhinitis 11/10/2013  ? Allergy   ? Alpha thalassemia silent carrier 01/23/2019  ? Chlamydia 05/18/2015  ? History of gestational hypertension 06/26/2019  ? History of Papanicolaou smear of cervix 05/18/2015  ? Nil/Pos  ? Preterm delivery 06/26/2019  ? Preterm labor 05/26/2019  ? ? ?Past Surgical History:  ?Past Surgical History:  ?Procedure Laterality Date  ? DILATION AND CURETTAGE OF UTERUS  05/2021  ? elective AB  ? HERNIA REPAIR    ? ? ?Past Obstetrical History:  ?OB History  ?Gravida Para Term Preterm AB Living  ?2 1 0 1 1 1   ?SAB IAB Ectopic Multiple Live Births  ?0 1 0 0 1  ?  ?# Outcome Date GA Lbr Len/2nd Weight Sex Delivery Anes PTL Lv  ?2 IAB 05/2021          ?1 Preterm 05/27/19 [redacted]w[redacted]d / 00:15 3 lb 13.4 oz (1.74 kg) F Vag-Spont None  LIV  ? ? ?Past Gynecological History: As per HPI. ?Periods: qmonth, regular, 1wk, not heavy or painful ?History of Pap Smear(s): yes Last pap 2018, negative ?She is currently using no method for contraception.  ? ?Social History:  ?Social History  ? ?Socioeconomic History  ? Marital status: Single  ?  Spouse name: Not on file  ? Number of children: Not on file  ? Years of  education: Not on file  ? Highest education level: Not on file  ?Occupational History  ? Not on file  ?Tobacco Use  ? Smoking status: Never  ? Smokeless tobacco: Never  ?Vaping Use  ? Vaping Use: Some days  ?Substance and Sexual Activity  ? Alcohol use: Not Currently  ? Drug use: No  ? Sexual activity: Yes  ?  Partners: Male  ?  Birth control/protection: None  ?Other Topics Concern  ? Not on file  ?Social History Narrative  ? Lives with mother  ? ?Social Determinants of Health  ? ?Financial Resource Strain: Not on file  ?Food Insecurity: Not on file  ?Transportation Needs: Not on file  ?Physical Activity: Not on file  ?Stress: Not on file  ?Social Connections: Not on file  ?Intimate Partner Violence: Not on file  ? ? ?Family History: No family history on file. ? ?Medications ?Habiba S. Smolinski had no medications administered during this visit. ?Current Outpatient Medications  ?Medication Sig Dispense Refill  ? acetaminophen (TYLENOL) 325 MG tablet Take 2 tablets (650 mg total) by mouth every 6 (six) hours as needed (for pain scale < 4). (Patient not taking: Reported on 06/26/2019) 30 tablet 0  ? ?No current facility-administered medications for this visit.  ? ? ?Allergies ?Azithromycin ? ? ?Physical Exam:  ?BP 132/82   Pulse 79   Ht 5\' 9"  (1.753  m)   Wt 149 lb 3.2 oz (67.7 kg)   LMP 09/10/2021 (Exact Date)   BMI 22.03 kg/m?  Body mass index is 22.03 kg/m?. ? ?General appearance: Well nourished, well developed female in no acute distress.  ?Neck:  Supple, normal appearance, and no thyromegaly  ?Cardiovascular: normal s1 and s2.  No murmurs, rubs or gallops. ?Respiratory:  Clear to auscultation bilateral. Normal respiratory effort ?Abdomen: positive bowel sounds and no masses, hernias; diffusely non tender to palpation, non distended ?Breasts: breasts appear normal, no suspicious masses, no skin or nipple changes or axillary nodes, and normal palpation. ?Neuro/Psych:  Normal mood and affect.  ?Skin:  Warm and dry.   ?Lymphatic:  No inguinal lymphadenopathy.  ? ?Pelvic exam: is not limited by body habitus ?EGBUS: within normal limits ?Vagina: within normal limits and with no blood or discharge in the vault ?Cervix: normal appearing cervix without tenderness, discharge or lesions. ?Uterus:  nonenlarged and non tender ?Adnexa:  normal adnexa and no mass, fullness, tenderness ?Rectovaginal: deferred ? ?Laboratory: none ? ?Radiology: none ? ?Assessment: pt doing well ? ?Plan:  ?1. Well woman exam with routine gynecological exam ?Patient interested in birth control. Options d/w her and will try the patch ?- Cytology - PAP ?- Hepatitis C antibody ?- Hepatitis B surface antigen ?- HIV Antibody (routine testing w rflx) ?- RPR ?- Lipid panel ?- TSH ?- Hemoglobin A1c ?- CBC ?- Comprehensive metabolic panel ?- Urinalysis, Routine w reflex microscopic ? ?2. Screen for STD (sexually transmitted disease) ? ?3. Cervical cancer screening ? ?Orders Placed This Encounter  ?Procedures  ? Hepatitis C antibody  ? Hepatitis B surface antigen  ? HIV Antibody (routine testing w rflx)  ? RPR  ? Lipid panel  ? TSH  ? Hemoglobin A1c  ? CBC  ? Comprehensive metabolic panel  ? Urinalysis, Routine w reflex microscopic  ? ? ?RTC 95m for BP check  ? ?Cornelia Copa MD ?Attending ?[facility]) ? ?

## 2021-09-30 ENCOUNTER — Encounter: Payer: Self-pay | Admitting: Obstetrics and Gynecology

## 2021-09-30 DIAGNOSIS — R7989 Other specified abnormal findings of blood chemistry: Secondary | ICD-10-CM

## 2021-09-30 HISTORY — DX: Other specified abnormal findings of blood chemistry: R79.89

## 2021-09-30 LAB — LIPID PANEL
Chol/HDL Ratio: 1.9 ratio (ref 0.0–4.4)
Cholesterol, Total: 191 mg/dL (ref 100–199)
HDL: 99 mg/dL (ref >39)
LDL Chol Calc (NIH): 80 mg/dL (ref 0–99)
Triglycerides: 65 mg/dL (ref 0–149)
VLDL Cholesterol Cal: 12 mg/dL (ref 5–40)

## 2021-09-30 LAB — RPR: RPR Ser Ql: NONREACTIVE

## 2021-09-30 LAB — URINALYSIS, ROUTINE W REFLEX MICROSCOPIC
Bilirubin, UA: NEGATIVE
Glucose, UA: NEGATIVE
Ketones, UA: NEGATIVE
Nitrite, UA: NEGATIVE
Specific Gravity, UA: 1.026 (ref 1.005–1.030)
Urobilinogen, Ur: 0.2 mg/dL (ref 0.2–1.0)
pH, UA: 5.5 (ref 5.0–7.5)

## 2021-09-30 LAB — CBC
Hematocrit: 47.4 % — ABNORMAL HIGH (ref 34.0–46.6)
Hemoglobin: 15.7 g/dL (ref 11.1–15.9)
MCH: 28.8 pg (ref 26.6–33.0)
MCHC: 33.1 g/dL (ref 31.5–35.7)
MCV: 87 fL (ref 79–97)
Platelets: 275 10*3/uL (ref 150–450)
RBC: 5.46 x10E6/uL — ABNORMAL HIGH (ref 3.77–5.28)
RDW: 13 % (ref 11.7–15.4)
WBC: 9.9 10*3/uL (ref 3.4–10.8)

## 2021-09-30 LAB — CYTOLOGY - PAP
Chlamydia: NEGATIVE
Comment: NEGATIVE
Comment: NEGATIVE
Comment: NORMAL
Diagnosis: NEGATIVE
Neisseria Gonorrhea: NEGATIVE
Trichomonas: POSITIVE — AB

## 2021-09-30 LAB — HEPATITIS B SURFACE ANTIGEN: Hepatitis B Surface Ag: NEGATIVE

## 2021-09-30 LAB — COMPREHENSIVE METABOLIC PANEL
ALT: 18 IU/L (ref 0–32)
AST: 15 IU/L (ref 0–40)
Albumin/Globulin Ratio: 1.8 (ref 1.2–2.2)
Albumin: 5.3 g/dL — ABNORMAL HIGH (ref 3.9–5.0)
Alkaline Phosphatase: 42 IU/L — ABNORMAL LOW (ref 44–121)
BUN/Creatinine Ratio: 14 (ref 9–23)
BUN: 14 mg/dL (ref 6–20)
Bilirubin Total: 0.4 mg/dL (ref 0.0–1.2)
CO2: 23 mmol/L (ref 20–29)
Calcium: 10.1 mg/dL (ref 8.7–10.2)
Chloride: 98 mmol/L (ref 96–106)
Creatinine, Ser: 1 mg/dL (ref 0.57–1.00)
Globulin, Total: 3 g/dL (ref 1.5–4.5)
Glucose: 85 mg/dL (ref 70–99)
Potassium: 3.9 mmol/L (ref 3.5–5.2)
Sodium: 136 mmol/L (ref 134–144)
Total Protein: 8.3 g/dL (ref 6.0–8.5)
eGFR: 80 mL/min/{1.73_m2} (ref >59)

## 2021-09-30 LAB — MICROSCOPIC EXAMINATION
Casts: NONE SEEN /lpf
Epithelial Cells (non renal): >10 /hpf — AB (ref 0–10)
WBC, UA: >30 /hpf — AB (ref 0–5)

## 2021-09-30 LAB — HEMOGLOBIN A1C
Est. average glucose Bld gHb Est-mCnc: 105 mg/dL
Hgb A1c MFr Bld: 5.3 % (ref 4.8–5.6)

## 2021-09-30 LAB — HEPATITIS C ANTIBODY: Hep C Virus Ab: NONREACTIVE

## 2021-09-30 LAB — HIV ANTIBODY (ROUTINE TESTING W REFLEX): HIV Screen 4th Generation wRfx: NONREACTIVE

## 2021-09-30 LAB — TSH: TSH: 5.28 u[IU]/mL — ABNORMAL HIGH (ref 0.450–4.500)

## 2021-10-02 ENCOUNTER — Encounter: Payer: Self-pay | Admitting: Obstetrics and Gynecology

## 2021-10-02 DIAGNOSIS — A599 Trichomoniasis, unspecified: Secondary | ICD-10-CM | POA: Insufficient documentation

## 2021-10-02 MED ORDER — METRONIDAZOLE 500 MG PO TABS: ORAL_TABLET | ORAL | 0 refills | Status: DC

## 2021-10-02 NOTE — Addendum Note (Signed)
Addended by: [place] Bing on: 10/02/2021 09:10 PM ? ? Modules accepted: Orders ? ?

## 2021-10-03 LAB — T4, FREE: Free T4: 1.21 ng/dL (ref 0.82–1.77)

## 2021-10-03 LAB — SPECIMEN STATUS REPORT

## 2021-10-14 ENCOUNTER — Telehealth: Payer: Self-pay

## 2021-10-14 NOTE — Telephone Encounter (Signed)
Pt called stating she is placing bc patch today and wanted to clarify she is doing it correctly. ?Pt advised she was placing it today and would replace it next Friday and I stated that was correct. I advised pt to replace it every Friday for 3 weeks and then leave it off for one week to have a cycle.  ?Pt verbalizes understanding.  ?

## 2021-12-02 ENCOUNTER — Ambulatory Visit: Payer: Medicaid Other

## 2021-12-05 ENCOUNTER — Ambulatory Visit: Payer: Medicaid Other

## 2022-08-09 ENCOUNTER — Ambulatory Visit
Admission: EM | Admit: 2022-08-09 | Discharge: 2022-08-09 | Disposition: A | Discharge status: Home / Self Care | Payer: Medicaid Other | Attending: Urgent Care | Admitting: Urgent Care

## 2022-08-09 ENCOUNTER — Other Ambulatory Visit: Payer: Self-pay | Admitting: Obstetrics and Gynecology

## 2022-08-09 DIAGNOSIS — N3001 Acute cystitis with hematuria: Secondary | ICD-10-CM | POA: Insufficient documentation

## 2022-08-09 DIAGNOSIS — N898 Other specified noninflammatory disorders of vagina: Secondary | ICD-10-CM | POA: Diagnosis not present

## 2022-08-09 LAB — POCT URINALYSIS DIP (MANUAL ENTRY)
Glucose, UA: NEGATIVE mg/dL
Ketones, POC UA: NEGATIVE mg/dL
Nitrite, UA: NEGATIVE
Protein Ur, POC: 30 mg/dL — AB
Spec Grav, UA: >=1.03 — AB (ref 1.010–1.025)
Urobilinogen, UA: 0.2 E.U./dL
pH, UA: 5.5 (ref 5.0–8.0)

## 2022-08-09 MED ORDER — FLUCONAZOLE 150 MG PO TABS: 150.0000 mg | ORAL_TABLET | Freq: Once | ORAL | 0 refills | Status: AC

## 2022-08-09 MED ORDER — NITROFURANTOIN MONOHYD MACRO 100 MG PO CAPS: 100.0000 mg | ORAL_CAPSULE | Freq: Two times a day (BID) | ORAL | 0 refills | Status: DC

## 2022-08-09 NOTE — ED Triage Notes (Signed)
Pt. Presents to UC w/ c/o vaginal itching, burning and thick white discharge that started yesterday.

## 2022-08-09 NOTE — ED Provider Notes (Signed)
Roderic Palau    CSN: [id number] Arrival date & time: 08/09/22  0818      History   Chief Complaint Chief Complaint  Patient presents with   Vaginal Itching   Vaginal Discharge    HPI Sandra Palmer is a 27 y.o. female.    Vaginal Itching  Vaginal Discharge Associated symptoms: vaginal itching     Patient presents to urgent care with complaint of vaginal itching, burning, thick white discharge starting yesterday.  Past Medical History:  Diagnosis Date   Allergic rhinitis 11/10/2013   Allergy    Alpha thalassemia silent carrier 01/23/2019   Chlamydia 05/18/2015   History of gestational hypertension 06/26/2019   History of Papanicolaou smear of cervix 05/18/2015   Nil/Pos   Preterm delivery 06/26/2019   Preterm labor 05/26/2019    Patient Active Problem List   Diagnosis Date Noted   Trichomoniasis 10/02/2021   Elevated TSH 09/30/2021    Past Surgical History:  Procedure Laterality Date   DILATION AND CURETTAGE OF UTERUS  05/2021   elective AB   HERNIA REPAIR      OB History     Gravida  2   Para  1   Term  0   Preterm  1   AB  1   Living  1      SAB  0   IAB  1   Ectopic  0   Multiple  0   Live Births  1            Home Medications    Prior to Admission medications   Medication Sig Start Date End Date Taking? Authorizing Provider  acetaminophen (TYLENOL) 325 MG tablet Take 2 tablets (650 mg total) by mouth every 6 (six) hours as needed (for pain scale < 4). Patient not taking: Reported on 06/26/2019 05/29/19   Aletha Halim, MD  cyclobenzaprine (FLEXERIL) 10 MG tablet Take 1 tablet (10 mg total) by mouth 2 (two) times daily as needed for muscle spasms. Patient not taking: Reported on 05/26/2019 04/20/19   Darlina Rumpf, CNM  doxycycline (VIBRAMYCIN) 100 MG capsule Take 1 capsule (100 mg total) by mouth 2 (two) times daily. Patient not taking: Reported on 09/29/2021 10/26/19   Triplett, Tammy, PA-C  fluticasone  (FLONASE) 50 MCG/ACT nasal spray Place 1 spray into both nostrils daily. Patient not taking: Reported on 06/26/2019 02/20/19   Emily Filbert, MD  ibuprofen (ADVIL) 600 MG tablet Take 1 tablet (600 mg total) by mouth every 6 (six) hours as needed. Patient not taking: Reported on 06/26/2019 05/29/19   Aletha Halim, MD  Levonorgestrel-Eth Estradiol [facility]) 120-30 MCG/24HR PTWK Place 1 patch onto the skin once a week. Apply TD qwk x 3wk and then leave off for a wk for a period. Repeat qmonth 09/29/21   Aletha Halim, MD  metroNIDAZOLE (FLAGYL) 500 MG tablet Take two tablets by mouth twice a day, for one day.  Or you can take all four tablets at once if you can tolerate it. 10/02/21   Aletha Halim, MD  mupirocin nasal ointment (BACTROBAN) 2 % Apply inside the affected nostril TID x 10 days using a cotton swab Patient not taking: Reported on 09/29/2021 10/26/19   Kem Parkinson, PA-C    Family History History reviewed. No pertinent family history.  Social History Social History   Tobacco Use   Smoking status: Never   Smokeless tobacco: Never  Vaping Use   Vaping Use: Some days  Substance Use  Topics   Alcohol use: Not Currently   Drug use: No     Allergies   Azithromycin   Review of Systems Review of Systems  Genitourinary:  Positive for vaginal discharge.     Physical Exam Triage Vital Signs ED Triage Vitals [08/09/22 0842]  Enc Vitals Group     BP 121/74     Pulse Rate 90     Resp 18     Temp 98.6 F (37 C)     Temp src      SpO2 97 %     Weight      Height      Head Circumference      Peak Flow      Pain Score 0     Pain Loc      Pain Edu?      Excl. in [place]?    No data found.  Updated Vital Signs BP 121/74   Pulse 90   Temp 98.6 F (37 C)   Resp 18   LMP 07/09/2022 (Approximate)   SpO2 97%   Visual Acuity Right Eye Distance:   Left Eye Distance:   Bilateral Distance:    Right Eye Near:   Left Eye Near:    Bilateral Near:     Physical  Exam Vitals reviewed.  Constitutional:      Appearance: Normal appearance.  Skin:    General: Skin is warm and dry.  Neurological:     General: No focal deficit present.     Mental Status: She is alert and oriented to person, place, and time.  Psychiatric:        Mood and Affect: Mood normal.        Behavior: Behavior normal.      UC Treatments / Results  Labs (all labs ordered are listed, but only abnormal results are displayed) Labs Reviewed  POCT URINALYSIS DIP (MANUAL ENTRY) - Abnormal; Notable for the following components:      Result Value   Clarity, UA cloudy (*)    Bilirubin, UA small (*)    Spec Grav, UA >=1.030 (*)    Blood, UA small (*)    Protein Ur, POC =30 (*)    Leukocytes, UA Small (1+) (*)    All other components within normal limits  CERVICOVAGINAL ANCILLARY ONLY    EKG   Radiology No results found.  Procedures Procedures (including critical care time)  Medications Ordered in UC Medications - No data to display  Initial Impression / Assessment and Plan / UC Course  I have reviewed the triage vital signs and the nursing notes.  Pertinent labs & imaging results that were available during my care of the patient were reviewed by me and considered in my medical decision making (see chart for details).   UA is significant for cloudy colored urine, small blood, small leukocytes.  Suggestive of UTI and will treat with Macrobid.  However presence of thick discharge suggest alternative diagnosis.  Vaginal swab was obtained and results pending will treat empirically for yeast whilst awaiting results.   Final Clinical Impressions(s) / UC Diagnoses   Final diagnoses:  Vaginal irritation   Discharge Instructions   None    ED Prescriptions   None    PDMP not reviewed this encounter.   Rose Phi, [place] 08/09/22 [phone]

## 2022-08-09 NOTE — Discharge Instructions (Addendum)
I have prescribed an antibiotic because of signs of infection in your urine.  Because you are also reporting vaginal discharge, I have prescribed a medication to treat a yeast infection.  Follow the directions on the prescription packaging.    The results of your swab are pending.  If they show an alternative diagnosis, someone will contact you regarding change in treatment plan.  Follow up here or with your primary care provider if your symptoms are worsening or not improving with treatment.

## 2022-08-10 ENCOUNTER — Encounter: Payer: Self-pay | Admitting: Emergency Medicine

## 2022-08-10 ENCOUNTER — Ambulatory Visit
Admission: EM | Admit: 2022-08-10 | Discharge: 2022-08-10 | Disposition: A | Discharge status: Home / Self Care | Payer: Medicaid Other

## 2022-08-10 DIAGNOSIS — N760 Acute vaginitis: Secondary | ICD-10-CM | POA: Diagnosis not present

## 2022-08-10 LAB — CERVICOVAGINAL ANCILLARY ONLY
Bacterial Vaginitis (gardnerella): NEGATIVE
Candida Glabrata: NEGATIVE
Candida Vaginitis: POSITIVE — AB
Chlamydia: NEGATIVE
Comment: NEGATIVE
Comment: NEGATIVE
Comment: NEGATIVE
Comment: NEGATIVE
Comment: NEGATIVE
Comment: NORMAL
Neisseria Gonorrhea: NEGATIVE
Trichomonas: NEGATIVE

## 2022-08-10 NOTE — ED Triage Notes (Signed)
Reports vaginal burning and itching x 2 days. States after taking the meds they gave her at urgent care her symptoms are no better. Taking fluconazole and nitrofurantoin since yesterday. Patient is requesting metronidazole because it's helped in the past.

## 2022-08-10 NOTE — ED Provider Notes (Signed)
[facility]    CSN: [id number] Arrival date & time: 08/10/22  0827      History   Chief Complaint No chief complaint on file.   HPI Sandra Palmer is a 27 y.o. female.   Patient here today for evaluation of continued vaginal irritation. She was seen yesterday and had labs ordered and was treated with nitrofurantoin and fluconazole but continues to have irritation. She would like metronidazole because this has helped in the past. She does not report any new symptoms.  The history is provided by the patient.    Past Medical History:  Diagnosis Date   Allergic rhinitis 11/10/2013   Allergy    Alpha thalassemia silent carrier 01/23/2019   Chlamydia 05/18/2015   History of gestational hypertension 06/26/2019   History of Papanicolaou smear of cervix 05/18/2015   Nil/Pos   Preterm delivery 06/26/2019   Preterm labor 05/26/2019    Patient Active Problem List   Diagnosis Date Noted   Trichomoniasis 10/02/2021   Elevated TSH 09/30/2021    Past Surgical History:  Procedure Laterality Date   DILATION AND CURETTAGE OF UTERUS  05/2021   elective AB   HERNIA REPAIR      OB History     Gravida  2   Para  1   Term  0   Preterm  1   AB  1   Living  1      SAB  0   IAB  1   Ectopic  0   Multiple  0   Live Births  1            Home Medications    Prior to Admission medications   Medication Sig Start Date End Date Taking? Authorizing Provider  acetaminophen (TYLENOL) 325 MG tablet Take 2 tablets (650 mg total) by mouth every 6 (six) hours as needed (for pain scale < 4). Patient not taking: Reported on 06/26/2019 05/29/19   Aletha Halim, MD  cyclobenzaprine (FLEXERIL) 10 MG tablet Take 1 tablet (10 mg total) by mouth 2 (two) times daily as needed for muscle spasms. Patient not taking: Reported on 05/26/2019 04/20/19   Darlina Rumpf, CNM  doxycycline (VIBRAMYCIN) 100 MG capsule Take 1 capsule (100 mg total) by mouth 2 (two) times  daily. Patient not taking: Reported on 09/29/2021 10/26/19   Triplett, Tammy, PA-C  fluticasone (FLONASE) 50 MCG/ACT nasal spray Place 1 spray into both nostrils daily. Patient not taking: Reported on 06/26/2019 02/20/19   Emily Filbert, MD  ibuprofen (ADVIL) 600 MG tablet Take 1 tablet (600 mg total) by mouth every 6 (six) hours as needed. Patient not taking: Reported on 06/26/2019 05/29/19   Aletha Halim, MD  Levonorgestrel-Eth Estradiol [facility]) 120-30 MCG/24HR PTWK Place 1 patch onto the skin once a week. Apply TD qwk x 3wk and then leave off for a wk for a period. Repeat qmonth 09/29/21   Aletha Halim, MD  metroNIDAZOLE (FLAGYL) 500 MG tablet Take two tablets by mouth twice a day, for one day.  Or you can take all four tablets at once if you can tolerate it. 10/02/21   Aletha Halim, MD  mupirocin nasal ointment (BACTROBAN) 2 % Apply inside the affected nostril TID x 10 days using a cotton swab Patient not taking: Reported on 09/29/2021 10/26/19   Triplett, Tammy, PA-C  nitrofurantoin, macrocrystal-monohydrate, (MACROBID) 100 MG capsule Take 1 capsule (100 mg total) by mouth 2 (two) times daily. 08/09/22   Immordino, Annie Main, FNP  Family History History reviewed. No pertinent family history.  Social History Social History   Tobacco Use   Smoking status: Never   Smokeless tobacco: Never  Vaping Use   Vaping Use: Some days  Substance Use Topics   Alcohol use: Not Currently   Drug use: No     Allergies   Azithromycin   Review of Systems Review of Systems  Constitutional:  Negative for chills and fever.  Eyes:  Negative for discharge and redness.  Gastrointestinal:  Negative for nausea and vomiting.  Genitourinary:  Positive for vaginal discharge. Negative for genital sores.     Physical Exam Triage Vital Signs ED Triage Vitals  Enc Vitals Group     BP      Pulse      Resp      Temp      Temp src      SpO2      Weight      Height      Head Circumference       Peak Flow      Pain Score      Pain Loc      Pain Edu?      Excl. in [place]?    No data found.  Updated Vital Signs BP 117/80 (BP Location: Left Arm)   Pulse 77   Temp 98.2 F (36.8 C) (Oral)   Resp 16   LMP 07/09/2022 (Approximate)   SpO2 97%   Physical Exam Vitals and nursing note reviewed.  Constitutional:      General: She is not in acute distress.    Appearance: Normal appearance. She is not ill-appearing.  HENT:     Head: Normocephalic and atraumatic.  Eyes:     Conjunctiva/sclera: Conjunctivae normal.  Cardiovascular:     Rate and Rhythm: Normal rate.  Pulmonary:     Effort: Pulmonary effort is normal. No respiratory distress.  Neurological:     Mental Status: She is alert.  Psychiatric:        Mood and Affect: Mood normal.        Behavior: Behavior normal.        Thought Content: Thought content normal.      UC Treatments / Results  Labs (all labs ordered are listed, but only abnormal results are displayed) Labs Reviewed - No data to display  EKG   Radiology No results found.  Procedures Procedures (including critical care time)  Medications Ordered in UC Medications - No data to display  Initial Impression / Assessment and Plan / UC Course  I have reviewed the triage vital signs and the nursing notes.  Pertinent labs & imaging results that were available during my care of the patient were reviewed by me and considered in my medical decision making (see chart for details).    Discussed that I recommend awaiting lab results for any further antibiotic therapy. Patient can try OTC antifungal topically or external application of OTC hydrocortisone for itching and reported swelling for a few days only. Recommended follow up with any further concerns.   Final Clinical Impressions(s) / UC Diagnoses   Final diagnoses:  Acute vaginitis   Discharge Instructions   None    ED Prescriptions   None    PDMP not reviewed this encounter.   Francene Finders, PA-C 08/10/22 1045

## 2022-08-11 ENCOUNTER — Other Ambulatory Visit: Payer: Self-pay

## 2022-08-11 ENCOUNTER — Emergency Department
Admission: EM | Admit: 2022-08-11 | Discharge: 2022-08-11 | Disposition: A | Discharge status: Home / Self Care | Payer: Medicaid Other | Attending: Emergency Medicine | Admitting: Emergency Medicine

## 2022-08-11 DIAGNOSIS — Z3201 Encounter for pregnancy test, result positive: Secondary | ICD-10-CM | POA: Insufficient documentation

## 2022-08-11 DIAGNOSIS — B9689 Other specified bacterial agents as the cause of diseases classified elsewhere: Secondary | ICD-10-CM | POA: Insufficient documentation

## 2022-08-11 DIAGNOSIS — N898 Other specified noninflammatory disorders of vagina: Secondary | ICD-10-CM | POA: Diagnosis present

## 2022-08-11 DIAGNOSIS — N760 Acute vaginitis: Secondary | ICD-10-CM | POA: Diagnosis not present

## 2022-08-11 MED ORDER — FLUCONAZOLE 150 MG PO TABS: 150.0000 mg | ORAL_TABLET | Freq: Every day | ORAL | 0 refills | Status: AC

## 2022-08-11 NOTE — ED Triage Notes (Signed)
Pt comes with c/o vaginal burning, itching and discharge. Pt states she had workup and give meds but it is still bothering [place].

## 2022-08-11 NOTE — ED Provider Notes (Signed)
[facility] Provider Note  Patient Contact: 7:22 PM (approximate)   History   Vaginal Discharge   HPI  Sandra Palmer is a 27 y.o. female presents to the emergency department with persistent vaginal irritation.  Patient tested positive for yeast vaginitis 2 days ago.  She is been taking Macrobid and has had 1 dose of fluconazole.  Patient denies dysuria or increased urinary frequency.  She states that she just has vaginal pruritus and a sensation of irritation.  No deep dyspareunia.  No chest pain, chest tightness or abdominal pain.      Physical Exam   Triage Vital Signs: ED Triage Vitals  Enc Vitals Group     BP 08/11/22 1837 124/88     Pulse Rate 08/11/22 1837 99     Resp 08/11/22 1837 17     Temp 08/11/22 1837 98.1 F (36.7 C)     Temp Source 08/11/22 1837 Oral     SpO2 08/11/22 1837 98 %     Weight --      Height --      Head Circumference --      Peak Flow --      Pain Score 08/11/22 1834 5     Pain Loc --      Pain Edu? --      Excl. in [place]? --     Most recent vital signs: Vitals:   08/11/22 1837 08/11/22 1948  BP: 124/88 122/80  Pulse: 99 87  Resp: 17 17  Temp: 98.1 F (36.7 C) 98 F (36.7 C)  SpO2: 98% 99%     General: Alert and in no acute distress. Eyes:  PERRL. EOMI. Head: No acute traumatic findings ENT:      Nose: No congestion/rhinnorhea.      Mouth/Throat: Mucous membranes are moist. Neck: No stridor. No cervical spine tenderness to palpation. Cardiovascular:  Good peripheral perfusion Respiratory: Normal respiratory effort without tachypnea or retractions. Lungs CTAB. Good air entry to the bases with no decreased or absent breath sounds. Gastrointestinal: Bowel sounds 4 quadrants. Soft and nontender to palpation. No guarding or rigidity. No palpable masses. No distention. No CVA tenderness. Musculoskeletal: Full range of motion to all extremities.  Neurologic:  No gross focal neurologic deficits are appreciated.   Skin:   No rash noted Other:   ED Results / Procedures / Treatments   Labs (all labs ordered are listed, but only abnormal results are displayed) Labs Reviewed  POC URINE PREG, ED       PROCEDURES:  Critical Care performed: No  Procedures   MEDICATIONS ORDERED IN ED: Medications - No data to display   IMPRESSION / MDM / [place] / ED COURSE  I reviewed the triage vital signs and the nursing notes.                              Assessment and plan: Vaginal irritation:   Positive pregnancy 27 year old female presents to the emergency department with concern for possible pregnancy and persistent vaginal irritation.  Vital signs reassuring at triage.  On exam, patient alert, active and nontoxic-appearing.  Urine pregnancy test was positive.  I reviewed cervical vaginal testing conducted by [facility] urgent care and patient tested positive for yeast.  Patient is currently taking Macrobid for UTI.  Recommended establishing care with [facility] and starting prenatal vitamins.  Patient denies pelvic pain, abdominal pain or vaginal bleeding.  FINAL CLINICAL IMPRESSION(S) / ED DIAGNOSES   Final diagnoses:  Acute vaginitis  Positive pregnancy test     Rx / DC Orders   ED Discharge Orders          Ordered    fluconazole (DIFLUCAN) 150 MG tablet  Daily        08/11/22 1942             Note:  This document was prepared using Dragon voice recognition software and may include unintentional dictation errors.   Vallarie Mare [place], Hershal Coria 08/11/22 2010    Lavonia Drafts, MD 08/14/22 [phone]

## 2022-08-11 NOTE — Discharge Instructions (Addendum)
Repeat dose of Diflucan tonight.  Wait 3 days and take 1 additional dose of Diflucan if still symptomatic. Please establish care with an OB/GYN. Start taking prenatal vitamins.

## 2022-08-11 NOTE — ED Notes (Signed)
Positive POC

## 2022-08-14 ENCOUNTER — Encounter: Payer: Self-pay | Admitting: Obstetrics and Gynecology

## 2022-08-14 ENCOUNTER — Ambulatory Visit (INDEPENDENT_AMBULATORY_CARE_PROVIDER_SITE_OTHER): Payer: Medicaid Other | Admitting: Obstetrics and Gynecology

## 2022-08-14 VITALS — BP 127/88 | HR 78 | Wt 145.0 lb

## 2022-08-14 DIAGNOSIS — N760 Acute vaginitis: Secondary | ICD-10-CM | POA: Diagnosis not present

## 2022-08-14 DIAGNOSIS — Z3201 Encounter for pregnancy test, result positive: Secondary | ICD-10-CM

## 2022-08-14 NOTE — Progress Notes (Unsigned)
Vaginal irritation, yeast is better but still having irritation  Had a positive UPT in ED, wasn't expecting that, LMP 07/16/22

## 2022-08-14 NOTE — Progress Notes (Unsigned)
Obstetrics and Gynecology Visit Return Patient Evaluation  Appointment Date: 08/14/2022  Primary Care Provider: Kathyrn Lass  OBGYN Clinic: [facility]  Chief Complaint: vaginitis  History of Present Illness:  Sandra Palmer is a 27 y.o. with above CC. Patient went to urgent care on 1/24 for vaginitis, irritation and d/c s/s. STI swab negative but positive for candida vaginitis. Possible uti on udip so macrobid sent in in addition to diflucan. She went back on 1/25 and 1/26 and positive UPT noted on 1/26 with LMP late December; a repeat diflucan dose was sent in on 1/26. She states she started monistat with boric acid yesterday and caused some irritation.   Review of Systems: as noted in the History of Present Illness.  Medications: as per HPI  Allergies: is allergic to azithromycin.  Physical Exam:  BP 127/88   Pulse 78   Wt 145 lb (65.8 kg)   LMP 07/16/2022 (Approximate)   BMI 21.41 kg/m  Body mass index is 21.41 kg/m. General appearance: Well nourished, well developed female in no acute distress.  Abdomen: diffusely non tender to palpation, non distended, and no masses, hernias Neuro/Psych:  Normal mood and affect.    Pelvic exam:  EGBUS: mildly edematous Vaginal vault: white d/c in vault Cervix:  negative, wnl  Assessment: patient stable  Plan:  1. Acute vaginitis D/w her to give it more time but don't recommend more diflucan given new pregnancy and two doses of diflucan should take care of it. I also recommend stopping apple cider vinegar douches.   2. Positive UPT New OB visit in one month Start prenatal vitamin  Aletha Halim, Brooke Bonito MD Attending Center for [facility])

## 2022-08-15 ENCOUNTER — Encounter: Payer: Self-pay | Admitting: Obstetrics and Gynecology

## 2022-08-15 DIAGNOSIS — Z3201 Encounter for pregnancy test, result positive: Secondary | ICD-10-CM | POA: Insufficient documentation

## 2022-08-16 ENCOUNTER — Inpatient Hospital Stay (HOSPITAL_COMMUNITY)
Admission: AD | Admit: 2022-08-16 | Discharge: 2022-08-16 | Disposition: A | Discharge status: Home / Self Care | Payer: Medicaid Other | Attending: Obstetrics and Gynecology | Admitting: Obstetrics and Gynecology

## 2022-08-16 ENCOUNTER — Other Ambulatory Visit: Payer: Self-pay

## 2022-08-16 ENCOUNTER — Encounter: Payer: Self-pay | Admitting: Student

## 2022-08-16 ENCOUNTER — Inpatient Hospital Stay (HOSPITAL_COMMUNITY): Payer: Medicaid Other

## 2022-08-16 DIAGNOSIS — O3680X0 Pregnancy with inconclusive fetal viability, not applicable or unspecified: Secondary | ICD-10-CM | POA: Diagnosis not present

## 2022-08-16 DIAGNOSIS — O209 Hemorrhage in early pregnancy, unspecified: Secondary | ICD-10-CM | POA: Diagnosis not present

## 2022-08-16 DIAGNOSIS — Z3A01 Less than 8 weeks gestation of pregnancy: Secondary | ICD-10-CM | POA: Diagnosis not present

## 2022-08-16 LAB — WET PREP, GENITAL
Clue Cells Wet Prep HPF POC: NONE SEEN
Sperm: NONE SEEN
Trich, Wet Prep: NONE SEEN
WBC, Wet Prep HPF POC: <10 (ref <10)
Yeast Wet Prep HPF POC: NONE SEEN

## 2022-08-16 LAB — CBC
HCT: 39.9 % (ref 36.0–46.0)
Hemoglobin: 13.3 g/dL (ref 12.0–15.0)
MCH: 29.4 pg (ref 26.0–34.0)
MCHC: 33.3 g/dL (ref 30.0–36.0)
MCV: 88.1 fL (ref 80.0–100.0)
Platelets: 273 10*3/uL (ref 150–400)
RBC: 4.53 MIL/uL (ref 3.87–5.11)
RDW: 13.1 % (ref 11.5–15.5)
WBC: 7.5 10*3/uL (ref 4.0–10.5)
nRBC: 0 % (ref 0.0–0.2)

## 2022-08-16 LAB — URINALYSIS, ROUTINE W REFLEX MICROSCOPIC
Bacteria, UA: NONE SEEN
Bilirubin Urine: NEGATIVE
Glucose, UA: NEGATIVE mg/dL
Ketones, ur: NEGATIVE mg/dL
Leukocytes,Ua: NEGATIVE
Nitrite: NEGATIVE
Protein, ur: NEGATIVE mg/dL
RBC / HPF: >50 RBC/hpf (ref 0–5)
Specific Gravity, Urine: 1.023 (ref 1.005–1.030)
pH: 7 (ref 5.0–8.0)

## 2022-08-16 LAB — HCG, QUANTITATIVE, PREGNANCY: hCG, Beta Chain, Quant, S: 7 m[IU]/mL — ABNORMAL HIGH (ref <5)

## 2022-08-16 NOTE — MAU Note (Signed)
Sandra Palmer is a 27 y.o. at [redacted]w[redacted]d here in MAU reporting: started having vaginal bleeding last night. Just put a pad on. States some clots. Having some cramping that also started last night.  LMP: 07/16/22  Onset of complaint: last night  Pain score: 3/10  Vitals:   08/16/22 1812  BP: 134/82  Pulse: 67  Resp: 16  Temp: 98 F (36.7 C)  SpO2: 100%     FHT:NA  Lab orders placed from triage: ua

## 2022-08-16 NOTE — MAU Provider Note (Signed)
History     CSN: [id number]  Arrival date and time: 08/16/22 1727   Event Date/Time   First Provider Initiated Contact with Patient 08/16/22 1938      Chief Complaint  Patient presents with   Abdominal Pain   Vaginal Bleeding   HPI  Sandra Palmer is a 27 y.o. [id number] at [redacted]w[redacted]d who presents for evaluation of vaginal bleeding. Patient reports yesterday she had heavy bleeding and passing clots. She states today it has improved. She denies any pain at this time. She states she found out she was pregnant in the emergency room on 1/26.  She denies any discharge. Denies any constipation, diarrhea or any urinary complaints.   OB History     Gravida  3   Para  1   Term  0   Preterm  1   AB  1   Living  1      SAB  0   IAB  1   Ectopic  0   Multiple  0   Live Births  1           Past Medical History:  Diagnosis Date   Allergic rhinitis 11/10/2013   Allergy    Alpha thalassemia silent carrier 01/23/2019   Chlamydia 05/18/2015   History of gestational hypertension 06/26/2019   History of Papanicolaou smear of cervix 05/18/2015   Nil/Pos   Preterm delivery 06/26/2019   Preterm labor 05/26/2019   Trichomoniasis     Past Surgical History:  Procedure Laterality Date   DILATION AND CURETTAGE OF UTERUS  05/2021   elective AB   HERNIA REPAIR      No family history on file.  Social History   Tobacco Use   Smoking status: Never   Smokeless tobacco: Never  Vaping Use   Vaping Use: Some days  Substance Use Topics   Alcohol use: Not Currently   Drug use: No    Allergies:  Allergies  Allergen Reactions   Azithromycin Swelling and Rash    Medications Prior to Admission  Medication Sig Dispense Refill Last Dose   cyclobenzaprine (FLEXERIL) 10 MG tablet Take 1 tablet (10 mg total) by mouth 2 (two) times daily as needed for muscle spasms. (Patient not taking: Reported on 05/26/2019) 20 tablet 0    Levonorgestrel-Eth Estradiol (TWIRLA) 120-30 MCG/24HR  PTWK Place 1 patch onto the skin once a week. Apply TD qwk x 3wk and then leave off for a wk for a period. Repeat qmonth (Patient not taking: Reported on 08/14/2022) 9 patch 3    nitrofurantoin, macrocrystal-monohydrate, (MACROBID) 100 MG capsule Take 1 capsule (100 mg total) by mouth 2 (two) times daily. (Patient not taking: Reported on 08/14/2022) 10 capsule 0     Review of Systems  Constitutional: Negative.  Negative for fatigue and fever.  HENT: Negative.    Respiratory: Negative.  Negative for shortness of breath.   Cardiovascular: Negative.  Negative for chest pain.  Gastrointestinal: Negative.  Negative for abdominal pain, constipation, diarrhea, nausea and vomiting.  Genitourinary:  Positive for vaginal bleeding. Negative for dysuria.  Neurological: Negative.  Negative for dizziness and headaches.   Physical Exam   Blood pressure 134/82, pulse 67, temperature 98 F (36.7 C), temperature source Oral, resp. rate 16, height 5\' 9"  (1.753 m), weight 66 kg, last menstrual period 07/16/2022, SpO2 100 %.  Patient Vitals for the past 24 hrs:  BP Temp Temp src Pulse Resp SpO2 Height Weight  08/16/22 1812 134/82 98 F (36.7  C) Oral 67 16 100 % -- --  08/16/22 1804 -- -- -- -- -- -- 5\' 9"  (1.753 m) 66 kg    Physical Exam Vitals and nursing note reviewed.  Constitutional:      General: She is not in acute distress.    Appearance: She is well-developed.  HENT:     Head: Normocephalic.  Eyes:     Pupils: Pupils are equal, round, and reactive to light.  Cardiovascular:     Rate and Rhythm: Normal rate and regular rhythm.     Heart sounds: Normal heart sounds.  Pulmonary:     Effort: Pulmonary effort is normal. No respiratory distress.     Breath sounds: Normal breath sounds.  Abdominal:     General: Bowel sounds are normal. There is no distension.     Palpations: Abdomen is soft.     Tenderness: There is no abdominal tenderness.  Skin:    General: Skin is warm and dry.   Neurological:     Mental Status: She is alert and oriented to person, place, and time.  Psychiatric:        Mood and Affect: Mood normal.        Behavior: Behavior normal.        Thought Content: Thought content normal.        Judgment: Judgment normal.     MAU Course  Procedures  Results for orders placed or performed during the hospital encounter of 08/16/22 (from the past 24 hour(s))  CBC     Status: None   Collection Time: 08/16/22  5:42 PM  Result Value Ref Range   WBC 7.5 4.0 - 10.5 K/uL   RBC 4.53 3.87 - 5.11 MIL/uL   Hemoglobin 13.3 12.0 - 15.0 g/dL   HCT 39.9 36.0 - 46.0 %   MCV 88.1 80.0 - 100.0 fL   MCH 29.4 26.0 - 34.0 pg   MCHC 33.3 30.0 - 36.0 g/dL   RDW 13.1 11.5 - 15.5 %   Platelets 273 150 - 400 K/uL   nRBC 0.0 0.0 - 0.2 %  hCG, quantitative, pregnancy     Status: Abnormal   Collection Time: 08/16/22  5:42 PM  Result Value Ref Range   hCG, Beta Chain, Quant, S 7 (H) <5 mIU/mL  Wet prep, genital     Status: None   Collection Time: 08/16/22  6:20 PM  Result Value Ref Range   Yeast Wet Prep HPF POC NONE SEEN NONE SEEN   Trich, Wet Prep NONE SEEN NONE SEEN   Clue Cells Wet Prep HPF POC NONE SEEN NONE SEEN   WBC, Wet Prep HPF POC <10 <10   Sperm NONE SEEN   Urinalysis, Routine w reflex microscopic -Urine, Clean Catch     Status: Abnormal   Collection Time: 08/16/22  6:40 PM  Result Value Ref Range   Color, Urine YELLOW YELLOW   APPearance HAZY (A) CLEAR   Specific Gravity, Urine 1.023 1.005 - 1.030   pH 7.0 5.0 - 8.0   Glucose, UA NEGATIVE NEGATIVE mg/dL   Hgb urine dipstick LARGE (A) NEGATIVE   Bilirubin Urine NEGATIVE NEGATIVE   Ketones, ur NEGATIVE NEGATIVE mg/dL   Protein, ur NEGATIVE NEGATIVE mg/dL   Nitrite NEGATIVE NEGATIVE   Leukocytes,Ua NEGATIVE NEGATIVE   RBC / HPF >50 0 - 5 RBC/hpf   WBC, UA 0-5 0 - 5 WBC/hpf   Bacteria, UA NONE SEEN NONE SEEN   Squamous Epithelial / HPF 0-5 0 -  5 /HPF   Mucus PRESENT      US OB LESS THAN 14 WEEKS  WITH OB TRANSVAGINAL  Result Date: 08/16/2022 CLINICAL DATA:  Pregnant patient with bleeding. EXAM: OBSTETRIC <14 WK [place] AND TRANSVAGINAL OB US TECHNIQUE: Both transabdominal and transvaginal ultrasound examinations were performed for complete evaluation of the gestation as well as the maternal uterus, adnexal regions, and pelvic cul-de-sac. Transvaginal technique was performed to assess early pregnancy. COMPARISON:  None Available. FINDINGS: Intrauterine gestational sac: None Maternal uterus/adnexae: Normal.  No free fluid in the pelvis. IMPRESSION: 1. The lack of an IUP in the setting of pregnancy may represent early pregnancy, recent miscarriage, or ectopic pregnancy. Recommend close clinical follow-up. Electronically Signed   By: Dorise Bullion III M.D.   On: 08/16/2022 19:04     MDM Labs ordered and reviewed.   UA, UPT CBC, HCG ABO/Rh- A Pos Wet prep and gc/chlamydia US OB Comp Less 14 weeks with Transvaginal  CNM consulted with Dr. Dione Plover regarding presentation and results- MD recommends repeat HCG on Friday to confirm likely SAB given low HCG after positive UPT.  Discussed with client the diagnosis of pregnancy of unknown anatomic location.  Three possibilities of outcome are: a healthy pregnancy that is too early to see a yolk sac to confirm the pregnancy is in the uterus, a pregnancy that is not healthy and has not developed and will not develop, and an ectopic pregnancy that is in the abdomen that cannot be identified at this time.  And ectopic pregnancy can be a life threatening situation as a pregnancy needs to be in the uterus which is a muscle and can stretch to accommodate the growth of a pregnancy.  Other structures in the pelvis and abdomen as not muscular and do not stretch with the growth of a pregnancy.  Worst case scenario is that a structure ruptures with a growing pregnancy not in the uterus and and internal hemorrhage can be a life threatening situation.  We need to follow the  progression of this pregnancy carefully.  We need to check another serum pregnancy hormone level to determine if the levels are rising appropriately  and to determine the next steps that are needed for you. Patient's questions were answered.  Patient to return to MAU on Friday due to no appointments available at [facility]  Assessment and Plan   1. Pregnancy of unknown anatomic location   2. [redacted] weeks gestation of pregnancy     -Discharge home in stable condition -Vaginal bleeding and pain precautions discussed -Patient advised to follow-up with MAU on Friday for repeat bloodwork -Patient may return to MAU as needed or if her condition were to change or worsen  Wende Mott, CNM 08/16/2022, 7:38 PM

## 2022-08-17 LAB — GC/CHLAMYDIA PROBE AMP (~~LOC~~) NOT AT ARMC
Chlamydia: NEGATIVE
Comment: NEGATIVE
Comment: NORMAL
Neisseria Gonorrhea: NEGATIVE

## 2022-08-18 ENCOUNTER — Other Ambulatory Visit (HOSPITAL_COMMUNITY): Admission: AD | Admit: 2022-08-18 | Payer: Medicaid Other | Source: Home / Self Care

## 2022-08-19 ENCOUNTER — Inpatient Hospital Stay (HOSPITAL_COMMUNITY)
Admission: AD | Admit: 2022-08-19 | Discharge: 2022-08-19 | Disposition: A | Discharge status: Home / Self Care | Payer: Medicaid Other | Attending: Obstetrics and Gynecology | Admitting: Obstetrics and Gynecology

## 2022-08-19 DIAGNOSIS — Z3A01 Less than 8 weeks gestation of pregnancy: Secondary | ICD-10-CM

## 2022-08-19 DIAGNOSIS — O200 Threatened abortion: Secondary | ICD-10-CM | POA: Insufficient documentation

## 2022-08-19 LAB — HCG, QUANTITATIVE, PREGNANCY: hCG, Beta Chain, Quant, S: 2 m[IU]/mL (ref <5)

## 2022-08-19 NOTE — MAU Provider Note (Signed)
Event Date/Time   First Provider Initiated Contact with Patient 08/19/22 2029      S [name] is a 27 y.o. [phone] patient who presents to MAU today with complaint of follow up hCG.  Patient states she was instructed to return to confirm if she was having a miscarriage. She reports continued bleeding and no pain.   O BP 133/65 (BP Location: Right Arm)   Pulse 73   Temp 99.3 F (37.4 C) (Oral)   Resp 15   Ht 5\' 9"  (1.753 m)   Wt 66.8 kg   LMP 07/16/2022 (Approximate)   SpO2 100%   BMI 21.75 kg/m  Physical Exam Vitals reviewed.  Constitutional:      Appearance: Normal appearance.  HENT:     Head: Normocephalic and atraumatic.  Eyes:     Conjunctiva/sclera: Conjunctivae normal.  Cardiovascular:     Rate and Rhythm: Normal rate.  Pulmonary:     Effort: Pulmonary effort is normal. No respiratory distress.  Musculoskeletal:        General: Normal range of motion.     Cervical back: Normal range of motion.  Neurological:     Mental Status: She is alert and oriented to person, place, and time.  Psychiatric:        Mood and Affect: Mood normal.        Behavior: Behavior normal.     A Medical screening exam started Follow Up Threatened Miscarriage  P -Labs ordered -Informed that in absence of pain, okay with discharge with follow up as appropriate. Patient agreeable. -Reviewed follow up process if it is a miscarriage including office visit in 2-4 weeks.  Patient reports she receives care with Dr. Sheryn Bison at [facility] -Patient expresses desire for more reliable birth control method. -Patient also questions if she can get documentation if she is having a miscarriage.  Informed that the loss would be reflected in her chart.  -Precautions reviewed. -Discharged to home with hCG pending.   Gavin Pound, [place] 08/19/2022 8:29 PM   Reassessment (10:14 PM) -hCG level at 2. -Mychart message sent to patient informing her of findings. -Message sent to office for follow up  and for initiation of BCM per patient request.   Maryann Conners MSN, CNM Advanced Practice Provider, Center for [facility]

## 2022-08-19 NOTE — MAU Note (Signed)
..  Sandra Palmer is a 27 y.o. at [redacted]w[redacted]d here in MAU reporting: here for repeat labs. Denies pain. Reports she is still having vaginal bleeding but it is less than before.   Pain score: 0/10 Vitals:   08/19/22 1953  BP: 133/65  Pulse: 73  Resp: 15  Temp: 99.3 F (37.4 C)  SpO2: 100%     FHT:not indicated Lab orders placed from triage: none

## 2022-08-21 ENCOUNTER — Telehealth: Payer: Self-pay

## 2022-08-21 NOTE — Telephone Encounter (Signed)
Left message for pt to call the office back regarding f/u appt for SAB. VM full

## 2022-08-22 ENCOUNTER — Other Ambulatory Visit: Payer: Self-pay | Admitting: Obstetrics and Gynecology

## 2022-08-22 ENCOUNTER — Ambulatory Visit: Payer: Medicaid Other | Admitting: Obstetrics and Gynecology

## 2022-08-22 MED ORDER — TWIRLA 120-30 MCG/24HR TD PTWK: MEDICATED_PATCH | TRANSDERMAL | 3 refills | Status: DC

## 2022-08-25 ENCOUNTER — Other Ambulatory Visit: Payer: Self-pay | Admitting: Obstetrics and Gynecology

## 2022-08-25 MED ORDER — FLUCONAZOLE 150 MG PO TABS: 150.0000 mg | ORAL_TABLET | ORAL | 0 refills | Status: DC

## 2022-08-25 MED ORDER — METRONIDAZOLE 500 MG PO TABS: 500.0000 mg | ORAL_TABLET | Freq: Two times a day (BID) | ORAL | 0 refills | Status: AC

## 2022-09-11 ENCOUNTER — Other Ambulatory Visit: Payer: Self-pay | Admitting: Obstetrics and Gynecology

## 2022-09-11 ENCOUNTER — Ambulatory Visit (INDEPENDENT_AMBULATORY_CARE_PROVIDER_SITE_OTHER): Payer: Medicaid Other

## 2022-09-11 VITALS — BP 128/77

## 2022-09-11 DIAGNOSIS — Z3201 Encounter for pregnancy test, result positive: Secondary | ICD-10-CM

## 2022-09-11 DIAGNOSIS — N912 Amenorrhea, unspecified: Secondary | ICD-10-CM | POA: Diagnosis not present

## 2022-09-11 LAB — POCT URINE PREGNANCY: Preg Test, Ur: POSITIVE — AB

## 2022-09-11 NOTE — Progress Notes (Signed)
Sandra Palmer here for a UPT. Pt had a positive upt at home. LMP is unknown as Pt had miscarriage on 2.3.24 .     UPT in office Positive.     Pt stating that she has also been wearing her birth control patch since roughly 2.11.24   Pt also stating that she is moving to [place] and is leaving this Thursday.

## 2022-09-12 ENCOUNTER — Other Ambulatory Visit: Payer: Medicaid Other

## 2022-09-12 DIAGNOSIS — Z3201 Encounter for pregnancy test, result positive: Secondary | ICD-10-CM

## 2022-09-13 ENCOUNTER — Encounter: Payer: Self-pay | Admitting: Obstetrics and Gynecology

## 2022-09-13 LAB — BETA HCG QUANT (REF LAB): hCG Quant: <1 m[IU]/mL

## 2022-09-23 ENCOUNTER — Encounter: Payer: Self-pay | Admitting: Advanced Practice Midwife

## 2022-09-23 ENCOUNTER — Encounter (HOSPITAL_COMMUNITY): Payer: Self-pay | Admitting: *Deleted

## 2022-09-23 ENCOUNTER — Inpatient Hospital Stay (HOSPITAL_COMMUNITY)
Admission: AD | Admit: 2022-09-23 | Discharge: 2022-09-23 | Disposition: A | Discharge status: Home / Self Care | Payer: Medicaid Other | Attending: Maternal & Fetal Medicine | Admitting: Maternal & Fetal Medicine

## 2022-09-23 DIAGNOSIS — Z3A00 Weeks of gestation of pregnancy not specified: Secondary | ICD-10-CM | POA: Insufficient documentation

## 2022-09-23 DIAGNOSIS — Z7251 High risk heterosexual behavior: Secondary | ICD-10-CM | POA: Diagnosis not present

## 2022-09-23 DIAGNOSIS — O3680X0 Pregnancy with inconclusive fetal viability, not applicable or unspecified: Secondary | ICD-10-CM | POA: Diagnosis present

## 2022-09-23 LAB — HCG, QUANTITATIVE, PREGNANCY: hCG, Beta Chain, Quant, S: 12617 m[IU]/mL — ABNORMAL HIGH (ref <5)

## 2022-09-23 NOTE — MAU Note (Signed)
Sandra Palmer is a 27 y.o. at 63w6dhere in MAU reporting: took 5 pregnancy tests(last was Thursday, all have been+.  Her breasts have been, nipples are dark.  She wants to know if she is pregnant or not, to know if she can party or not.  [facility] [place] [facility] feels [facility].  No bleeding. LMP: 12/31- had miscarriage end of Jan/first of Feb no period Onset of complaint: neg  blood test at office 2/27, + UPT in office 2/26 Pain score: none Vitals:   09/23/22 1658  BP: 133/74  Pulse: 88  Resp: 16  Temp: 98.5 F (36.9 C)     Lab orders placed from triage:   Blood work drawn in triage

## 2022-09-23 NOTE — MAU Provider Note (Signed)
Event Date/Time  First Provider Initiated Contact with Patient 09/23/22 1704     S [name] is a 27 y.o. [phone] patient who presents to MAU today with concern for pregnancy. She is requesting ultrasound. Her only complaints at this time are "black nipples" and breast tenderness. She denies abdominal pain, vaginal bleeding, dysuria, fever or recent illness. Patient restates to CNM that she needs to know if it is ok to consume alcohol.  O BP 133/74 (BP Location: Right Arm)   Pulse 88   Temp 98.5 F (36.9 C) (Oral)   Resp 16   Ht '5\' 9"'$  (1.753 m)   Wt 67.9 kg   LMP  (LMP Unknown) Comment: no period since miscarriage end of Jan/early Feb  Breastfeeding Unknown   BMI 22.12 kg/m   Physical Exam Vitals and nursing note reviewed.  Constitutional:      Appearance: Normal appearance. She is not ill-appearing.  Cardiovascular:     Rate and Rhythm: Normal rate.  Pulmonary:     Effort: Pulmonary effort is normal.  Chest:  Breasts:    Breasts are symmetrical.     Right: Normal. No skin change.     Left: Normal. No skin change.  Abdominal:     General: Abdomen is flat.  Neurological:     Mental Status: She is alert and oriented to person, place, and time.  Psychiatric:        Mood and Affect: Mood normal.        Behavior: Behavior normal.        Thought Content: Thought content normal.        Judgment: Judgment normal.    A Medical screening exam complete EMR reviewed. Patient in office 09/14/2022 with positive UPT immediately followed by Quant hCG <1 Given concern for recent contradictory results and in the setting of daily unprotected intercourse, patient accepted my offer of repeat Quant Patient requests discharge home, provider to message via [place] with results  Results for orders placed or performed during the hospital encounter of 09/23/22 (from the past 24 hour(s))  hCG, quantitative, pregnancy     Status: Abnormal   Collection Time: 09/23/22  4:57 PM  Result  Value Ref Range   hCG, Beta Chain, Quant, S 12,617 (H) <5 mIU/mL    P Discharge from MAU in stable condition Patient may return to MAU as needed for pregnancy related emergencies  F/U Patient without complaints to necessitate MAU imaging MyChart message sent confirming positive serum hCG Patient offered viability ultrasound, currently receives care with [facility] sent to [place] requesting coordination of viability ultrasound pending response from patient  Mallie Snooks, [place], MSN, CNM Certified Nurse Midwife, Product/process development scientist for [employer], [place]

## 2022-09-25 ENCOUNTER — Telehealth: Payer: Self-pay

## 2022-09-25 NOTE — Telephone Encounter (Signed)
Called pt to see which facility she wanted her care at.

## 2023-03-06 ENCOUNTER — Ambulatory Visit: Payer: Medicaid Other | Admitting: Obstetrics and Gynecology

## 2023-03-07 ENCOUNTER — Telehealth: Payer: Self-pay

## 2023-03-07 ENCOUNTER — Ambulatory Visit: Payer: Medicaid Other | Admitting: Internal Medicine

## 2023-03-07 NOTE — Telephone Encounter (Signed)
8.21.24 new pt no show/no letter sent /pt has medicaid/ pt blocked for future scheduling

## 2023-03-09 ENCOUNTER — Other Ambulatory Visit: Payer: Self-pay

## 2023-03-09 ENCOUNTER — Ambulatory Visit (INDEPENDENT_AMBULATORY_CARE_PROVIDER_SITE_OTHER): Payer: Medicaid Other | Admitting: *Deleted

## 2023-03-09 VITALS — BP 116/80 | HR 87 | Wt 151.0 lb

## 2023-03-09 DIAGNOSIS — Z3A01 Less than 8 weeks gestation of pregnancy: Secondary | ICD-10-CM

## 2023-03-09 DIAGNOSIS — Z3201 Encounter for pregnancy test, result positive: Secondary | ICD-10-CM

## 2023-03-09 DIAGNOSIS — O3680X1 Pregnancy with inconclusive fetal viability, fetus 1: Secondary | ICD-10-CM

## 2023-03-09 DIAGNOSIS — Z3481 Encounter for supervision of other normal pregnancy, first trimester: Secondary | ICD-10-CM

## 2023-03-09 NOTE — Progress Notes (Signed)
New OB Intake  I explained I am completing New OB Intake today. We discussed EDD of 10/24/2023, by Last Menstrual Period. Pt is [id number]. I reviewed her allergies, medications and Medical/Surgical/OB history.    Patient Active Problem List   Diagnosis Date Noted   Positive pregnancy test 08/15/2022    Concerns addressed today  Patient informed that the ultrasound is considered a limited obstetric ultrasound and is not intended to be a complete ultrasound exam.  Patient also informed that the ultrasound is not being completed with the intent of assessing for fetal or placental anomalies or any pelvic abnormalities. Explained that the purpose of today's ultrasound is to assess for viability.  Patient acknowledges the purpose of the exam and the limitations of the study.      First visit review  Dr Alvester Morin at bedside. Explained that we saw a gestational sac, yolk sac and a fetal pole today. No definitve FHR.  Will have patient return in one week for repeat US to confirm viability.    Scheryl Marten, RN 03/09/2023  11:15 AM

## 2023-03-16 ENCOUNTER — Ambulatory Visit (INDEPENDENT_AMBULATORY_CARE_PROVIDER_SITE_OTHER): Payer: Medicaid Other | Admitting: Family Medicine

## 2023-03-16 ENCOUNTER — Other Ambulatory Visit (INDEPENDENT_AMBULATORY_CARE_PROVIDER_SITE_OTHER): Payer: Medicaid Other

## 2023-03-16 VITALS — BP 114/80 | HR 72

## 2023-03-16 DIAGNOSIS — Z3A08 8 weeks gestation of pregnancy: Secondary | ICD-10-CM | POA: Diagnosis not present

## 2023-03-16 DIAGNOSIS — Z3A01 Less than 8 weeks gestation of pregnancy: Secondary | ICD-10-CM | POA: Diagnosis not present

## 2023-03-16 DIAGNOSIS — O0289 Other abnormal products of conception: Secondary | ICD-10-CM

## 2023-03-16 DIAGNOSIS — Z3201 Encounter for pregnancy test, result positive: Secondary | ICD-10-CM

## 2023-03-16 DIAGNOSIS — O021 Missed abortion: Secondary | ICD-10-CM

## 2023-03-16 MED ORDER — ONDANSETRON 4 MG PO TBDP: 4.0000 mg | ORAL_TABLET | Freq: Four times a day (QID) | ORAL | 0 refills | Status: DC | PRN

## 2023-03-16 MED ORDER — OXYCODONE-ACETAMINOPHEN 5-325 MG PO TABS: 1.0000 | ORAL_TABLET | ORAL | 0 refills | Status: DC | PRN

## 2023-03-16 MED ORDER — HYDROCODONE-ACETAMINOPHEN 5-325 MG PO TABS: 1.0000 | ORAL_TABLET | Freq: Four times a day (QID) | ORAL | 0 refills | Status: DC | PRN

## 2023-03-16 MED ORDER — MISOPROSTOL 200 MCG PO TABS: ORAL_TABLET | ORAL | 2 refills | Status: DC

## 2023-03-16 NOTE — Progress Notes (Signed)
   MISCARRIAGE/DC follow up  Subjective:   Sandra Palmer is a 27 y.o. [phone]  female here for [place] follow up, by LMP is  [redacted]w[redacted]d pregnancy. Korea last week was not definitive for failed pregnancy. Today [place] confirmed lack of progression. Denies continued bleeding/ cramping.   The following portions of the patient's history were reviewed and updated as appropriate: allergies, current medications, past family history, past medical history, past social history, past surgical history and problem list.  Review of Systems Pertinent items are noted in HPI.   Objective:  BP 114/80   Pulse 72   LMP 01/17/2023 Comment: no period since miscarriage end of Jan/early Feb Gen: well appearing, NAD HEENT: no scleral icterus CV: RR Lung: Normal WOB Ext: warm well perfused  Lab Results  Component Value Date   ABORH A POS 05/26/2019   ABORH  05/26/2019    A POS Performed at [facility], [address], [place], [place] [address]      Assessment and Plan:  1. Miscarriage within last 12 months - Missed AB or incomplete AB? Yes if yes --offer expectant management, cytotec or DC.  Confirmed < [redacted] weeks gestation and eligible for cytotec. Counseled about how it might take 2-3 weeks to get in for procedure and that cytotec is reasonable to try to avoid procedure.  Patient prefers cytotec  Given rx for cytotec, zofran and norco Recommended repeat urine pregnancy test in 2 weeks, if positive we would like to see her to confirm passage of POC.  Please refer to After Visit Summary for other counseling recommendations.   No follow-ups on file.

## 2023-03-16 NOTE — Progress Notes (Signed)
Patient informed that the ultrasound is considered a limited obstetric ultrasound and is not intended to be a complete ultrasound exam.  Patient also informed that the ultrasound is not being completed with the intent of assessing for fetal or placental anomalies or any pelvic abnormalities. Explained that the purpose of today's ultrasound is to assess for fetal heart rate.  Patient acknowledges the purpose of the exam and the limitations of the study.     Christine Soliz, RN     

## 2023-03-16 NOTE — Addendum Note (Signed)
Addended by: Geanie Berlin on: 03/16/2023 12:25 PM   Modules accepted: Orders

## 2023-04-09 ENCOUNTER — Ambulatory Visit: Payer: Medicaid Other | Admitting: Obstetrics and Gynecology

## 2023-04-09 ENCOUNTER — Encounter: Payer: Self-pay | Admitting: Obstetrics and Gynecology

## 2023-04-10 NOTE — Progress Notes (Signed)
Patient did not keep her GYN appointment for 04/09/2023.  Cornelia Copa MD Attending Center for [employer] Midwife)

## 2023-05-23 ENCOUNTER — Other Ambulatory Visit: Payer: Self-pay | Admitting: Obstetrics and Gynecology

## 2023-05-23 ENCOUNTER — Encounter: Payer: Self-pay | Admitting: Obstetrics and Gynecology

## 2023-05-23 MED ORDER — TWIRLA 120-30 MCG/24HR TD PTWK: MEDICATED_PATCH | TRANSDERMAL | 3 refills | Status: DC

## 2023-09-03 ENCOUNTER — Encounter: Payer: Self-pay | Admitting: *Deleted

## 2023-09-03 ENCOUNTER — Ambulatory Visit: Payer: Medicaid Other | Admitting: Obstetrics and Gynecology

## 2023-09-03 ENCOUNTER — Encounter: Payer: Self-pay | Admitting: Obstetrics and Gynecology

## 2023-09-04 NOTE — Progress Notes (Signed)
Patient did not keep her GYN appointment for 09/03/2023.  Cornelia Copa MD Attending [facility] for [employer] Midwife)

## 2023-11-26 ENCOUNTER — Ambulatory Visit: Admitting: Obstetrics and Gynecology

## 2024-01-10 ENCOUNTER — Ambulatory Visit: Admitting: Obstetrics and Gynecology

## 2024-01-28 ENCOUNTER — Ambulatory Visit (INDEPENDENT_AMBULATORY_CARE_PROVIDER_SITE_OTHER)

## 2024-01-28 ENCOUNTER — Encounter: Admitting: Family Medicine

## 2024-01-28 VITALS — BP 131/85 | HR 84 | Ht 67.0 in | Wt 155.6 lb

## 2024-01-28 DIAGNOSIS — Z3201 Encounter for pregnancy test, result positive: Secondary | ICD-10-CM | POA: Diagnosis not present

## 2024-01-28 DIAGNOSIS — N926 Irregular menstruation, unspecified: Secondary | ICD-10-CM

## 2024-01-28 LAB — POCT URINE PREGNANCY: Preg Test, Ur: POSITIVE — AB

## 2024-01-28 NOTE — Progress Notes (Signed)
 Sandra Palmer Irving here for a UPT. Pt had a positive upt at home. LMP is 12/28/2023.     UPT in office Positive.    Reviewed medications and informed to start a PNV. Pt to follow up on 03/18/2024 at 2:50pm for new OB visit.

## 2024-01-28 NOTE — Progress Notes (Unsigned)
 Patient presents for Annual.  LMP: *** Last pap: {Blank single:19197::Date: ***,First pap today,***} Contraception: {Blank single:19197::OCP,POP,Nuvaring,Patch,Depo,Nexplanon,IUD: ***,Natural family planning,None,Not sexually active,Post-menopausal,Not indicated,***} Mammogram: {Blank single:19197::Up to date: ***,Not yet indicated,Due, last mammogram: ***} STD Screening: {Blank single:19197::Accepts,Declines,not indicated,***}   CC: {Blank single:19197::Annual/None,Annual and ***,***}

## 2024-01-28 NOTE — Progress Notes (Signed)
Attestation of Attending Supervision of clinical support staff: I agree with the care provided to this patient and was available for any consultation.  I have reviewed the RN's note and chart. I was available for consult and to see the patient if needed.   Liborio Saccente MD MPH Attending Physician Faculty Practice- [facility]  

## 2024-01-29 NOTE — Progress Notes (Signed)
 Error

## 2024-02-26 ENCOUNTER — Ambulatory Visit: Admitting: *Deleted

## 2024-02-26 ENCOUNTER — Other Ambulatory Visit (INDEPENDENT_AMBULATORY_CARE_PROVIDER_SITE_OTHER): Payer: Self-pay

## 2024-02-26 VITALS — BP 127/79 | HR 83 | Wt 160.0 lb

## 2024-02-26 DIAGNOSIS — O09899 Supervision of other high risk pregnancies, unspecified trimester: Secondary | ICD-10-CM | POA: Insufficient documentation

## 2024-02-26 DIAGNOSIS — O3680X0 Pregnancy with inconclusive fetal viability, not applicable or unspecified: Secondary | ICD-10-CM

## 2024-02-26 DIAGNOSIS — Z3481 Encounter for supervision of other normal pregnancy, first trimester: Secondary | ICD-10-CM

## 2024-02-26 DIAGNOSIS — Z3480 Encounter for supervision of other normal pregnancy, unspecified trimester: Secondary | ICD-10-CM | POA: Insufficient documentation

## 2024-02-26 DIAGNOSIS — Z3A08 8 weeks gestation of pregnancy: Secondary | ICD-10-CM | POA: Diagnosis not present

## 2024-02-26 NOTE — Progress Notes (Addendum)
 New OB Intake  I explained I am completing New OB Intake today. We discussed EDD of 10/03/2024, by Last Menstrual Period. Pt is [id number]. I reviewed her allergies, medications and Medical/Surgical/OB history.    Patient Active Problem List   Diagnosis Date Noted   Supervision of other normal pregnancy, antepartum 02/26/2024   History of preterm delivery, currently pregnant 02/26/2024    Concerns addressed today  Patient informed that the ultrasound is considered a limited obstetric ultrasound and is not intended to be a complete ultrasound exam.  Patient also informed that the ultrasound is not being completed with the intent of assessing for fetal or placental anomalies or any pelvic abnormalities. Explained that the purpose of today's ultrasound is to assess for viability.  Patient acknowledges the purpose of the exam and the limitations of the study.     Delivery Plans Plans to deliver at [facility] Discussed the nature of our practice with multiple providers including residents and students. Due to the size of the practice, the delivering provider may not be the same as those providing prenatal care.     Anatomy US  Explained first scheduled US  will be around 19 weeks.   Last Pap Diagnosis  Date Value Ref Range Status  09/29/2021   Final   - Negative for intraepithelial lesion or malignancy (NILM)    First visit review I reviewed new OB appt with patient. Explained pt will be seen by Dr Izell at first visit. Discussed Jennell genetic screening with patient will get Panorama at [facility]. Routine prenatal labs to be collected at [facility].    Wanda Buckles, RN 02/26/2024  10:33 AM

## 2024-03-01 NOTE — Progress Notes (Signed)
 PPD Reading Note PPD read and results entered in EpicCare. Result: 0 mm induration. Interpretation: negative If test not read within 48-72 hours of initial placement, patient advised to repeat in other arm 1-3 weeks after this test. Allergic reaction: no   Ambulatory billing notes: In developing the above medical care plan, I completed a medically appropriate history and/or examination with indirect supervision of physicians who were available for collaboration but not directly involved with this encounter.

## 2024-03-18 ENCOUNTER — Other Ambulatory Visit (HOSPITAL_COMMUNITY)
Admission: RE | Admit: 2024-03-18 | Discharge: 2024-03-18 | Disposition: A | Discharge status: Home / Self Care | Source: Ambulatory Visit | Attending: Obstetrics and Gynecology | Admitting: Obstetrics and Gynecology

## 2024-03-18 ENCOUNTER — Ambulatory Visit (INDEPENDENT_AMBULATORY_CARE_PROVIDER_SITE_OTHER): Admitting: Obstetrics and Gynecology

## 2024-03-18 VITALS — BP 134/84 | HR 77 | Wt 157.4 lb

## 2024-03-18 DIAGNOSIS — Z3A11 11 weeks gestation of pregnancy: Secondary | ICD-10-CM

## 2024-03-18 DIAGNOSIS — D563 Thalassemia minor: Secondary | ICD-10-CM | POA: Diagnosis not present

## 2024-03-18 DIAGNOSIS — Z3481 Encounter for supervision of other normal pregnancy, first trimester: Secondary | ICD-10-CM

## 2024-03-18 DIAGNOSIS — Z113 Encounter for screening for infections with a predominantly sexual mode of transmission: Secondary | ICD-10-CM | POA: Diagnosis not present

## 2024-03-18 DIAGNOSIS — O09899 Supervision of other high risk pregnancies, unspecified trimester: Secondary | ICD-10-CM

## 2024-03-18 DIAGNOSIS — Z8759 Personal history of other complications of pregnancy, childbirth and the puerperium: Secondary | ICD-10-CM | POA: Diagnosis not present

## 2024-03-18 DIAGNOSIS — Z1331 Encounter for screening for depression: Secondary | ICD-10-CM

## 2024-03-18 DIAGNOSIS — O09891 Supervision of other high risk pregnancies, first trimester: Secondary | ICD-10-CM

## 2024-03-18 DIAGNOSIS — Z3480 Encounter for supervision of other normal pregnancy, unspecified trimester: Secondary | ICD-10-CM

## 2024-03-18 MED ORDER — ASPIRIN 81 MG PO TBEC: 81.0000 mg | DELAYED_RELEASE_TABLET | Freq: Every day | ORAL | 1 refills | Status: DC

## 2024-03-18 NOTE — Progress Notes (Signed)
 New OB Note  03/18/2024   Clinic: [facility]  Chief Complaint: new OB  Transfer of Care Patient: No  History of Present Illness: [name] is a 28 y.o. [id number] at 11/4 weeks (EDC 3/20, based on Patient's last menstrual period was 12/28/2023 (exact date).=8wk u/s)   Pregnancy complicated by has Alpha thalassemia silent carrier; History of gestational hypertension; Supervision of other normal pregnancy, antepartum; and History of preterm delivery, currently pregnant on their problem list.   Mild nausea of pregnancy s/s.   ROS: A 12-point review of systems was performed and negative, except as stated in the above HPI.  OBGYN History: As per HPI. OB History  Gravida Para Term Preterm AB Living  5 1 0 1 3 1   SAB IAB Ectopic Multiple Live Births  2 1 0 0 1    # Outcome Date GA Lbr Len/2nd Weight Sex Type Anes PTL Lv  5 Current           4 SAB 03/16/23 [redacted]w[redacted]d         3 SAB 08/2022          2 IAB 05/2021          1 Preterm 05/27/19 [redacted]w[redacted]d / 00:15 3 lb 13.4 oz (1.74 kg) F Vag-Spont None  LIV   History of pap smears: Yes. Last pap smear 2023 and results were negative   Past Medical History: Past Medical History:  Diagnosis Date   Allergic rhinitis 11/10/2013   Allergy    Alpha thalassemia silent carrier 01/23/2019   Chlamydia 05/18/2015   Elevated TSH 09/30/2021   Normal ft4     History of gestational hypertension 06/26/2019   History of Papanicolaou smear of cervix 05/18/2015   Nil/Pos   Preterm delivery 06/26/2019   Preterm labor 05/26/2019   Trichomoniasis     Past Surgical History: Past Surgical History:  Procedure Laterality Date   DILATION AND CURETTAGE OF UTERUS  05/2021   elective AB   HERNIA REPAIR      Family History:  No family history on file.  Social History:  Social History   Socioeconomic History   Marital status: Single    Spouse name: Not on file   Number of children: Not on file   Years of education: Not on file    Highest education level: Not on file  Occupational History   Not on file  Tobacco Use   Smoking status: Never   Smokeless tobacco: Never  Vaping Use   Vaping status: Some Days  Substance and Sexual Activity   Alcohol use: Not Currently   Drug use: No   Sexual activity: Yes    Partners: Male    Birth control/protection: None  Other Topics Concern   Not on file  Social History Narrative   Lives with mother   Social Drivers of Health   Financial Resource Strain: Not on file  Food Insecurity: Not on file  Transportation Needs: Not on file  Physical Activity: Not on file  Stress: Not on file  Social Connections: Not on file  Intimate Partner Violence: Not on file   Allergy: Allergies  Allergen Reactions   Azithromycin Swelling and Rash   Current Outpatient Medications: Prenatal vitamin  Physical Exam:   BP 134/84   Pulse 77   Wt 157 lb 6.4 oz (71.4 kg)   LMP 12/28/2023 (Exact Date)   BMI 24.65 kg/m  Body mass index is 24.65 kg/m. Contractions: Not present Vag. Bleeding: None.  FHTs: 160s  General appearance: Well nourished, well developed female in no acute distress.  Neck:  Supple, normal appearance, and no thyromegaly  Cardiovascular: S1, S2 normal, no murmur, rub or gallop, regular rate and rhythm Respiratory:  Clear to auscultation bilateral. Normal respiratory effort Abdomen: positive bowel sounds and no masses, hernias; diffusely non tender to palpation, non distended Breasts: no s/s Neuro/Psych:  Normal mood and affect.  Skin:  Warm and dry.   Laboratory: none  Imaging:  As per HPI  Assessment: patient doing well  Plan: 1. [redacted] weeks gestation of pregnancy (Primary) Routine care - CBC/D/Plt+RPR+Rh+ABO+RubIgG... - Culture, OB Urine - Cervicovaginal ancillary only( [place]) - Hemoglobin A1c  2. History of preterm delivery, currently pregnant 2020 at 32wks with PTL and then PTB shortly thereafter. No current interventions available. I told  her to be aware of any s/s around this GA this pregnancy I told her I recommend an afp next visit 3. Alpha thalassemia silent carrier  4. History of gestational hypertension Pt amenable to starting a low dose AFP - Comprehensive metabolic panel with GFR - Protein / creatinine ratio, urine  5. Supervision of other normal pregnancy, antepartum - PANORAMA PRENATAL TEST  Problem list reviewed and updated.  Follow up in 4 weeks.  >50% of 30 min visit spent on counseling and coordination of care.  No follow-ups on file.  Future Appointments  Date Time Provider Department Center  05/09/2024  1:00 PM [facility] PROVIDER 1 [facility]  05/09/2024  1:30 PM [facility] US3 [facility]    Bebe Izell Overcast MD Attending [facility])

## 2024-03-19 LAB — COMPREHENSIVE METABOLIC PANEL WITH GFR
ALT: 11 IU/L (ref 0–32)
AST: 13 IU/L (ref 0–40)
Albumin: 4.6 g/dL (ref 4.0–5.0)
Alkaline Phosphatase: 38 IU/L — ABNORMAL LOW (ref 44–121)
BUN/Creatinine Ratio: 14 (ref 9–23)
BUN: 12 mg/dL (ref 6–20)
Bilirubin Total: 0.2 mg/dL (ref 0.0–1.2)
CO2: 21 mmol/L (ref 20–29)
Calcium: 9.7 mg/dL (ref 8.7–10.2)
Chloride: 101 mmol/L (ref 96–106)
Creatinine, Ser: 0.85 mg/dL (ref 0.57–1.00)
Globulin, Total: 2.7 g/dL (ref 1.5–4.5)
Glucose: 77 mg/dL (ref 70–99)
Potassium: 4.1 mmol/L (ref 3.5–5.2)
Sodium: 137 mmol/L (ref 134–144)
Total Protein: 7.3 g/dL (ref 6.0–8.5)
eGFR: 96 mL/min/1.73 (ref >59)

## 2024-03-19 LAB — PROTEIN / CREATININE RATIO, URINE
Creatinine, Urine: 201.4 mg/dL
Protein, Ur: 46.2 mg/dL
Protein/Creat Ratio: 229 mg/g{creat} — ABNORMAL HIGH (ref 0–200)

## 2024-03-19 LAB — CBC/D/PLT+RPR+RH+ABO+RUBIGG...
Antibody Screen: NEGATIVE
Basophils Absolute: 0 x10E3/uL (ref 0.0–0.2)
Basos: 0 %
EOS (ABSOLUTE): 0.3 x10E3/uL (ref 0.0–0.4)
Eos: 3 %
HCV Ab: NONREACTIVE
HIV Screen 4th Generation wRfx: NONREACTIVE
Hematocrit: 41 % (ref 34.0–46.6)
Hemoglobin: 13.3 g/dL (ref 11.1–15.9)
Hepatitis B Surface Ag: NEGATIVE
Immature Grans (Abs): 0 x10E3/uL (ref 0.0–0.1)
Immature Granulocytes: 0 %
Lymphocytes Absolute: 2.9 x10E3/uL (ref 0.7–3.1)
Lymphs: 25 %
MCH: 29.7 pg (ref 26.6–33.0)
MCHC: 32.4 g/dL (ref 31.5–35.7)
MCV: 92 fL (ref 79–97)
Monocytes Absolute: 0.5 x10E3/uL (ref 0.1–0.9)
Monocytes: 5 %
Neutrophils Absolute: 7.7 x10E3/uL — ABNORMAL HIGH (ref 1.4–7.0)
Neutrophils: 67 %
Platelets: 259 x10E3/uL (ref 150–450)
RBC: 4.48 x10E6/uL (ref 3.77–5.28)
RDW: 13.4 % (ref 11.7–15.4)
RPR Ser Ql: NONREACTIVE
Rh Factor: POSITIVE
Rubella Antibodies, IGG: 21.9 {index} (ref >0.99)
WBC: 11.5 x10E3/uL — ABNORMAL HIGH (ref 3.4–10.8)

## 2024-03-19 LAB — HCV INTERPRETATION

## 2024-03-19 LAB — HEMOGLOBIN A1C
Est. average glucose Bld gHb Est-mCnc: 111 mg/dL
Hgb A1c MFr Bld: 5.5 % (ref 4.8–5.6)

## 2024-03-19 LAB — CULTURE, OB URINE

## 2024-03-19 LAB — URINE CULTURE, OB REFLEX

## 2024-03-20 LAB — CERVICOVAGINAL ANCILLARY ONLY
Chlamydia: NEGATIVE
Comment: NEGATIVE
Comment: NEGATIVE
Comment: NORMAL
Neisseria Gonorrhea: NEGATIVE
Trichomonas: NEGATIVE

## 2024-03-21 ENCOUNTER — Ambulatory Visit: Payer: Self-pay | Admitting: Obstetrics and Gynecology

## 2024-03-21 DIAGNOSIS — Z3480 Encounter for supervision of other normal pregnancy, unspecified trimester: Secondary | ICD-10-CM

## 2024-03-26 LAB — PANORAMA PRENATAL TEST FULL PANEL:PANORAMA TEST PLUS 5 ADDITIONAL MICRODELETIONS: FETAL FRACTION: 14

## 2024-04-08 ENCOUNTER — Other Ambulatory Visit: Payer: Self-pay | Admitting: *Deleted

## 2024-04-08 MED ORDER — PRENATAL 28-0.8 MG PO TABS: 1.0000 | ORAL_TABLET | Freq: Every day | ORAL | 12 refills | Status: AC

## 2024-04-17 ENCOUNTER — Ambulatory Visit (INDEPENDENT_AMBULATORY_CARE_PROVIDER_SITE_OTHER): Admitting: Family Medicine

## 2024-04-17 VITALS — BP 138/93 | HR 73 | Wt 156.0 lb

## 2024-04-17 DIAGNOSIS — Z3480 Encounter for supervision of other normal pregnancy, unspecified trimester: Secondary | ICD-10-CM

## 2024-04-17 DIAGNOSIS — O09899 Supervision of other high risk pregnancies, unspecified trimester: Secondary | ICD-10-CM

## 2024-04-17 DIAGNOSIS — Z8759 Personal history of other complications of pregnancy, childbirth and the puerperium: Secondary | ICD-10-CM

## 2024-04-17 DIAGNOSIS — Z3A15 15 weeks gestation of pregnancy: Secondary | ICD-10-CM

## 2024-04-17 DIAGNOSIS — O09892 Supervision of other high risk pregnancies, second trimester: Secondary | ICD-10-CM | POA: Diagnosis not present

## 2024-04-17 NOTE — Progress Notes (Signed)
   PRENATAL VISIT NOTE  Subjective:  NEKESHA FONT is a 28 y.o. [phone] at [redacted]w[redacted]d being seen today for ongoing prenatal care.  She is currently monitored for the following issues for this high-risk pregnancy and has Alpha thalassemia silent carrier; History of gestational hypertension; Supervision of other normal pregnancy, antepartum; and History of preterm delivery, currently pregnant on their problem list.  Patient reports no complaints.  Contractions: Not present. Vag. Bleeding: None.  Movement: Present. Denies leaking of fluid.   The following portions of the patient's history were reviewed and updated as appropriate: allergies, current medications, past family history, past medical history, past social history, past surgical history and problem list.   Objective:    Vitals:   04/17/24 1551  BP: (!) 138/93  Pulse: 73  Weight: 156 lb (70.8 kg)    Fetal Status:  Fetal Heart Rate (bpm): 145   Movement: Present    General: Alert, oriented and cooperative. Patient is in no acute distress.  Skin: Skin is warm and dry. No rash noted.   Cardiovascular: Normal heart rate noted  Respiratory: Normal respiratory effort, no problems with respiration noted  Abdomen: Soft, gravid, appropriate for gestational age.  Pain/Pressure: Absent     Pelvic: Cervical exam deferred        Extremities: Normal range of motion.     Mental Status: Normal mood and affect. Normal behavior. Normal judgment and thought content.   Assessment and Plan:  Pregnancy: G5P0131 at [redacted]w[redacted]d 1. Supervision of other normal pregnancy, antepartum (Primary) AFP today Anatomy scheduled - AFP, Serum, Open Spina Bifida  2. History of preterm delivery, currently pregnant At risk for repeat  3. History of gestational hypertension On ASA  4. [redacted] weeks gestation of pregnancy  General obstetric precautions including but not limited to vaginal bleeding, contractions, leaking of fluid and fetal movement were reviewed in detail  with the patient. Please refer to After Visit Summary for other counseling recommendations.   Return in 4 weeks (on 05/15/2024).  Future Appointments  Date Time Provider Department Center  05/09/2024  1:00 PM [facility] PROVIDER 1 [facility]  05/09/2024  1:30 PM [facility] US3 [facility]  05/15/2024  3:30 PM Izell Harari, MD [facility]    [name], MD

## 2024-04-19 ENCOUNTER — Ambulatory Visit: Payer: Self-pay | Admitting: Family Medicine

## 2024-04-19 DIAGNOSIS — Z3480 Encounter for supervision of other normal pregnancy, unspecified trimester: Secondary | ICD-10-CM

## 2024-04-19 LAB — AFP, SERUM, OPEN SPINA BIFIDA
AFP MoM: 2.26
AFP Value: 75.3 ng/mL
Gest. Age on Collection Date: 15.6 wk
Maternal Age At EDD: 29 a
OSBR Risk 1 IN: 925
Test Results:: NEGATIVE
Weight: 156 [lb_av]

## 2024-04-21 ENCOUNTER — Telehealth: Payer: Self-pay

## 2024-05-09 ENCOUNTER — Ambulatory Visit: Attending: Obstetrics | Admitting: Obstetrics

## 2024-05-09 ENCOUNTER — Other Ambulatory Visit: Payer: Self-pay | Admitting: Obstetrics and Gynecology

## 2024-05-09 ENCOUNTER — Ambulatory Visit (HOSPITAL_BASED_OUTPATIENT_CLINIC_OR_DEPARTMENT_OTHER)

## 2024-05-09 ENCOUNTER — Other Ambulatory Visit: Payer: Self-pay | Admitting: *Deleted

## 2024-05-09 VITALS — BP 127/79 | HR 90

## 2024-05-09 DIAGNOSIS — Z8759 Personal history of other complications of pregnancy, childbirth and the puerperium: Secondary | ICD-10-CM

## 2024-05-09 DIAGNOSIS — Z3480 Encounter for supervision of other normal pregnancy, unspecified trimester: Secondary | ICD-10-CM

## 2024-05-09 DIAGNOSIS — Z148 Genetic carrier of other disease: Secondary | ICD-10-CM | POA: Insufficient documentation

## 2024-05-09 DIAGNOSIS — O09899 Supervision of other high risk pregnancies, unspecified trimester: Secondary | ICD-10-CM

## 2024-05-09 DIAGNOSIS — Z7982 Long term (current) use of aspirin: Secondary | ICD-10-CM | POA: Insufficient documentation

## 2024-05-09 DIAGNOSIS — Z3A19 19 weeks gestation of pregnancy: Secondary | ICD-10-CM | POA: Insufficient documentation

## 2024-05-09 DIAGNOSIS — O09292 Supervision of pregnancy with other poor reproductive or obstetric history, second trimester: Secondary | ICD-10-CM | POA: Insufficient documentation

## 2024-05-09 DIAGNOSIS — Z363 Encounter for antenatal screening for malformations: Secondary | ICD-10-CM | POA: Diagnosis not present

## 2024-05-09 DIAGNOSIS — O358XX0 Maternal care for other (suspected) fetal abnormality and damage, not applicable or unspecified: Secondary | ICD-10-CM

## 2024-05-09 DIAGNOSIS — O09212 Supervision of pregnancy with history of pre-term labor, second trimester: Secondary | ICD-10-CM | POA: Insufficient documentation

## 2024-05-09 NOTE — Progress Notes (Signed)
 MFM Consult Note  Sandra Palmer is currently at 19 weeks and 0 days.  She was seen for detailed fetal anatomy scan due to a prior spontaneous preterm birth at 32 weeks and 5 days.  She denies any prior surgeries to her cervix.  Her prior pregnancy was also complicated by gestational hypertension.  She denies any significant past medical history and denies any problems in her current pregnancy.    She had a cell free DNA test earlier in her pregnancy which indicated a low risk for trisomy 75, 66, and 13. A female fetus is predicted.   Sonographic findings Single intrauterine pregnancy at 19w 0d  Fetal cardiac activity:  Observed and appears normal. Presentation: Breech. The anatomic structures that were well seen appear normal. Due to poor acoustic windows and the fetal position some structures remain suboptimally visualized. Fetal biometry shows the estimated fetal weight at the 67th percentile.  Amniotic fluid: Within normal limits.  MVP: 7.65 cm. Placenta: Anterior. Adnexa: No abnormality visualized. Cervical length: 3.07 cm.  Due to her history of a prior preterm birth, a transvaginal ultrasound performed today showed a cervical length of 3.07 cm long without any signs of funneling.    She was reassured that based on the normal cervical length obtained today, her risk of a preterm birth is low at this time.  The patient was informed that anomalies may be missed due to technical limitations. If the fetus is in a suboptimal position or maternal habitus is increased, visualization of the fetus in the maternal uterus may be impaired.  Due to her history of gestational hypertension, the patient was advised to continue taking a daily baby aspirin  for preeclampsia prophylaxis.    Due to her history of a prior preterm birth, we will continue to follow her with growth ultrasounds throughout her pregnancy.  Preterm labor precautions were reviewed.  Another ultrasound exam was scheduled in 6 weeks  to reassess the views of the fetal anatomy and to assess the fetal growth.    The patient stated that all of her questions were answered today.  A total of 30 minutes was spent counseling and coordinating the care for this patient.  Greater than 50% of the time was spent in direct face-to-face contact.

## 2024-05-15 ENCOUNTER — Encounter: Admitting: Obstetrics and Gynecology

## 2024-05-16 ENCOUNTER — Ambulatory Visit: Admitting: Family Medicine

## 2024-05-16 VITALS — BP 124/77 | HR 80 | Wt 164.0 lb

## 2024-05-16 DIAGNOSIS — Z8759 Personal history of other complications of pregnancy, childbirth and the puerperium: Secondary | ICD-10-CM | POA: Diagnosis not present

## 2024-05-16 DIAGNOSIS — O09892 Supervision of other high risk pregnancies, second trimester: Secondary | ICD-10-CM

## 2024-05-16 DIAGNOSIS — Z3A20 20 weeks gestation of pregnancy: Secondary | ICD-10-CM

## 2024-05-16 DIAGNOSIS — O09899 Supervision of other high risk pregnancies, unspecified trimester: Secondary | ICD-10-CM

## 2024-05-16 DIAGNOSIS — Z3480 Encounter for supervision of other normal pregnancy, unspecified trimester: Secondary | ICD-10-CM

## 2024-05-16 NOTE — Progress Notes (Signed)
   PRENATAL VISIT NOTE  Subjective:  Sandra Palmer is a 28 y.o. [phone] at [redacted]w[redacted]d being seen today for ongoing prenatal care.  She is currently monitored for the following issues for this high-risk pregnancy and has Alpha thalassemia silent carrier; History of gestational hypertension; Supervision of other normal pregnancy, antepartum; and History of preterm delivery, currently pregnant on their problem list.  Patient reports no complaints.  Contractions: Not present. Vag. Bleeding: None.  Movement: Present. Denies leaking of fluid.   The following portions of the patient's history were reviewed and updated as appropriate: allergies, current medications, past family history, past medical history, past social history, past surgical history and problem list.   Objective:    Vitals:   05/16/24 1138  BP: 124/77  Pulse: 80  Weight: 164 lb (74.4 kg)    Fetal Status:  Fetal Heart Rate (bpm): 145 Fundal Height: 20 cm Movement: Present    General: Alert, oriented and cooperative. Patient is in no acute distress.  Skin: Skin is warm and dry. No rash noted.   Cardiovascular: Normal heart rate noted  Respiratory: Normal respiratory effort, no problems with respiration noted  Abdomen: Soft, gravid, appropriate for gestational age.  Pain/Pressure: Absent     Pelvic: Cervical exam deferred        Extremities: Normal range of motion.  Edema: None  Mental Status: Normal mood and affect. Normal behavior. Normal judgment and thought content.   Assessment and Plan:  Pregnancy: G5P0131 at [redacted]w[redacted]d  1. Supervision of other normal pregnancy, antepartum (Primary) Up to date FH appropriate  Feeling movement Reviewed height and took picture for [facility]. -- patient is 3ft7inches     Dizzy/lightheaded-  no palpitations, racing heart. HGB wnl at last check.  Monitor sx.    2. History of preterm delivery, currently pregnant CL WNL at anatomy scan  3. History of gestational hypertension Baseline labs  WNL ASA  Preterm labor symptoms and general obstetric precautions including but not limited to vaginal bleeding, contractions, leaking of fluid and fetal movement were reviewed in detail with the patient. Please refer to After Visit Summary for other counseling recommendations.   Return in about 4 weeks (around 06/13/2024) for Routine prenatal care.  Future Appointments  Date Time Provider Department Center  06/20/2024 11:15 AM [facility] PROVIDER 1 [facility]  06/20/2024 11:30 AM [facility] US4 [facility]    Suzen Maryan Masters, MD

## 2024-05-16 NOTE — Progress Notes (Signed)
 OB   CC: None   Pt asked about ht HT is already on file as 5'7in

## 2024-06-17 ENCOUNTER — Ambulatory Visit (INDEPENDENT_AMBULATORY_CARE_PROVIDER_SITE_OTHER): Admitting: Obstetrics & Gynecology

## 2024-06-17 VITALS — BP 122/74 | HR 93 | Wt 165.5 lb

## 2024-06-17 DIAGNOSIS — O09892 Supervision of other high risk pregnancies, second trimester: Secondary | ICD-10-CM

## 2024-06-17 DIAGNOSIS — Z3A24 24 weeks gestation of pregnancy: Secondary | ICD-10-CM

## 2024-06-17 DIAGNOSIS — Z8759 Personal history of other complications of pregnancy, childbirth and the puerperium: Secondary | ICD-10-CM | POA: Diagnosis not present

## 2024-06-17 DIAGNOSIS — Z3480 Encounter for supervision of other normal pregnancy, unspecified trimester: Secondary | ICD-10-CM

## 2024-06-17 DIAGNOSIS — O09899 Supervision of other high risk pregnancies, unspecified trimester: Secondary | ICD-10-CM

## 2024-06-17 NOTE — Progress Notes (Signed)
   PRENATAL VISIT NOTE  Subjective:  Sandra Palmer is a 28 y.o. [phone] at [redacted]w[redacted]d being seen today for ongoing prenatal care.  She is currently monitored for the following issues for this low-risk pregnancy and has Alpha thalassemia silent carrier; History of gestational hypertension; Supervision of other normal pregnancy, antepartum; and History of preterm delivery, currently pregnant on their problem list.  Patient reports no complaints.  Contractions: Not present. Vag. Bleeding: None.  Movement: Present. Denies leaking of fluid.   The following portions of the patient's history were reviewed and updated as appropriate: allergies, current medications, past family history, past medical history, past social history, past surgical history and problem list.   Objective:   Vitals:   06/17/24 1141  BP: 122/74  Pulse: 93  Weight: 165 lb 8 oz (75.1 kg)    Fetal Status:  Fetal Heart Rate (bpm): 150   Movement: Present    General: Alert, oriented and cooperative. Patient is in no acute distress.  Skin: Skin is warm and dry. No rash noted.   Cardiovascular: Normal heart rate noted  Respiratory: Normal respiratory effort, no problems with respiration noted  Abdomen: Soft, gravid, appropriate for gestational age.  Pain/Pressure: Absent     Pelvic: Cervical exam deferred        Extremities: Normal range of motion.     Mental Status: Normal mood and affect. Normal behavior. Normal judgment and thought content.      03/18/2024    3:52 PM  Depression screen PHQ 2/9  Decreased Interest 1  Down, Depressed, Hopeless 0  PHQ - 2 Score 1  Altered sleeping 0  Tired, decreased energy 1  Change in appetite 2  Feeling bad or failure about yourself  2  Trouble concentrating 0  Moving slowly or fidgety/restless 0  Suicidal thoughts 0  PHQ-9 Score 6      Data saved with a previous flowsheet row definition        03/18/2024    3:52 PM  GAD 7 : Generalized Anxiety Score  Nervous, Anxious, on Edge 0   Control/stop worrying 0  Worry too much - different things 0  Trouble relaxing 0  Restless 0  Easily annoyed or irritable 0  Afraid - awful might happen 0  Total GAD 7 Score 0    Assessment and Plan:  Pregnancy: G5P0131 at [redacted]w[redacted]d 1. History of preterm delivery, currently pregnant (Primary) No concerns, normal cervical length on anatomy scan. Rescan to be done on 12/5, will follow up results and manage accordingly. PTL precautions advised.  2. History of gestational hypertension Stable BO, on ASA.  3. [redacted] weeks gestation of pregnancy 4. Supervision of other normal pregnancy, antepartum No other concerns.  GTT, Tdap and labs next visit. Preterm labor symptoms and general obstetric precautions including but not limited to vaginal bleeding, contractions, leaking of fluid and fetal movement were reviewed in detail with the patient. Please refer to After Visit Summary for other counseling recommendations.   Return in about 4 weeks (around 07/15/2024) for 2 hr GTT, 3rd trimester labs, TDap, OFFICE OB VISIT (MD or APP).  Future Appointments  Date Time Provider Department Center  06/20/2024 11:15 AM [facility] PROVIDER 1 [facility]  06/20/2024 11:30 AM [facility] US4 [facility]  07/15/2024  8:30 AM [facility] LAB [facility]  07/15/2024  8:55 AM Fredirick Glenys GORMAN, MD [facility]  08/12/2024 10:35 AM Fredirick Glenys GORMAN, MD [facility]    Gloris Hugger, MD

## 2024-06-17 NOTE — Patient Instructions (Signed)
 Oral Glucose Tolerance Test During Pregnancy Why am I having this test? The oral glucose tolerance test (GTT) is done to check how your body processes blood sugar (glucose). This is one of several tests used to diagnose diabetes that develops during pregnancy (gestational diabetes mellitus). Gestational diabetes is a short-term form of diabetes that some women develop while they are pregnant. It usually occurs during the second or third trimester of pregnancy and goes away after delivery. Testing, or screening, for gestational diabetes usually occurs around 69 of pregnancy. This test may also be needed earlier if: You have a history of gestational diabetes. There is a history of giving birth to very large babies or of losing pregnancies (having stillbirths). You have signs and symptoms of diabetes, such as: Changes in your eyesight. Tingling or numbness in your hands or feet. Changes in hunger, thirst, and urination, and these are not explained by your pregnancy. What is being tested? This test measures the amount of glucose in your blood at different times during a period of 2 hours. This shows how well your body can process glucose.  You will have three separate blood draws. What kind of sample is taken?  Blood samples are required for this test. They are usually collected by inserting a needle into a blood vessel. How do I prepare for this test? For 3 days before your test, eat normally. Have plenty of carbohydrate-rich foods. You will be asked not to eat or drink anything other than water (to fast) starting 8-10 hours before the test. Tell a health care provider about: All medicines you are taking, including vitamins, herbs, eye drops, creams, and over-the-counter medicines. Any blood disorders you have. Any surgeries you have had. Any medical conditions you have. What happens during the test? First, your blood glucose will be measured. This is referred to as your fasting blood glucose  because you fasted before the test. Then, you will drink a glucose solution that contains a certain amount of glucose. Your blood glucose will be measured again 1 and 2 hours after you drink the solution. This test takes about 2 hours to complete. You will need to stay at the testing location during this time. During the testing period: Do not eat or drink anything other than the glucose solution. Do not exercise. Do not use any products that contain nicotine or tobacco, such as cigarettes, e-cigarettes, and chewing tobacco. These can affect your test results. If you need help quitting, ask your health care provider. The testing procedure may vary among health care providers and hospitals. How are the results reported? Your results will be reported as milligrams of glucose per deciliter of blood (mg/dL) or millimoles per liter (mmol/L). There is more than one source for screening and diagnosis reference values used to diagnose gestational diabetes. Your health care provider will compare your results to normal values that were established after testing a large group of people (reference values). Reference values may vary among labs and hospitals. For this test, reference values are: Fasting: 92 mg/dL 1 hour: 161 mg/dL  2 hour: 096 mg/dL   What do the results mean? Results below the reference values are considered normal. If one or more of your blood glucose levels are at or above the reference values, you will be diagnosed with gestational diabetes.  Talk with your health care provider about what your results mean. Questions to ask your health care provider Ask your health care provider, or the department that is doing the test: When  will my results be ready? How will I get my results? What are my treatment options? What other tests do I need? What are my next steps? Summary The oral glucose tolerance test (GTT) is one of several tests used to diagnose diabetes that develops during pregnancy  (gestational diabetes mellitus). Gestational diabetes is a short-term form of diabetes that some women develop while they are pregnant. You may also have this test if you have any symptoms or risk factors for this type of diabetes. Talk with your health care provider about what your results mean. This information is not intended to replace advice given to you by your health care provider. Make sure you discuss any questions you have with your health care provider.  TDaP Vaccine Pregnancy Get the Whooping Cough Vaccine While You Are Pregnant (CDC)  It is important for women to get the whooping cough vaccine in the third trimester of each pregnancy. Vaccines are the best way to prevent this disease. There are 2 different whooping cough vaccines. Both vaccines combine protection against whooping cough, tetanus and diphtheria, but they are for different age groups: Tdap: for everyone 11 years or older, including pregnant women  DTaP: for children 2 months through 45 years of age  You need the whooping cough vaccine during each of your pregnancies The recommended time to get the shot is during your 27th through 36th week of pregnancy, preferably during the earlier part of this time period. The Centers for Disease Control and Prevention (CDC) recommends that pregnant women receive the whooping cough vaccine for adolescents and adults (called Tdap vaccine) during the third trimester of each pregnancy. The recommended time to get the shot is during your 27th through 36th week of pregnancy, preferably during the earlier part of this time period. This replaces the original recommendation that pregnant women get the vaccine only if they had not previously received it. The [employer] and the [employer] support this recommendation.  You should get the whooping cough vaccine while pregnant to pass protection to your baby frame support disabled  and/or not supported in this browser  Learn why Vernona Rieger decided to get the whooping cough vaccine in her 3rd trimester of pregnancy and how her baby girl was born with some protection against the disease. Also available on YouTube. After receiving the whooping cough vaccine, your body will create protective antibodies (proteins produced by the body to fight off diseases) and pass some of them to your baby before birth. These antibodies provide your baby some short-term protection against whooping cough in early life. These antibodies can also protect your baby from some of the more serious complications that come along with whooping cough. Your protective antibodies are at their highest about 2 weeks after getting the vaccine, but it takes time to pass them to your baby. So the preferred time to get the whooping cough vaccine is early in your third trimester. The amount of whooping cough antibodies in your body decreases over time. That is why CDC recommends you get a whooping cough vaccine during each pregnancy. Doing so allows each of your babies to get the greatest number of protective antibodies from you. This means each of your babies will get the best protection possible against this disease.  Getting the whooping cough vaccine while pregnant is better than getting the vaccine after you give birth Whooping cough vaccination during pregnancy is ideal so your baby will have short-term protection as soon as he  is born. This early protection is important because your baby will not start getting his whooping cough vaccines until he is 2 months old. These first few months of life are when your baby is at greatest risk for catching whooping cough. This is also when he's at greatest risk for having severe, potentially life-threating complications from the infection. To avoid that gap in protection, it is best to get a whooping cough vaccine during pregnancy. You will then pass protection to your baby before he  is born. To continue protecting your baby, he should get whooping cough vaccines starting at 2 months old. You may never have gotten the Tdap vaccine before and did not get it during this pregnancy. If so, you should make sure to get the vaccine immediately after you give birth, before leaving the hospital or birthing center. It will take about 2 weeks before your body develops protection (antibodies) in response to the vaccine. Once you have protection from the vaccine, you are less likely to give whooping cough to your newborn while caring for him. But remember, your baby will still be at risk for catching whooping cough from others. A recent study looked to see how effective Tdap was at preventing whooping cough in babies whose mothers got the vaccine while pregnant or in the hospital after giving birth. The study found that getting Tdap between 27 through 36 weeks of pregnancy is 85% more effective at preventing whooping cough in babies younger than 2 months old. Blood tests cannot tell if you need a whooping cough vaccine There are no blood tests that can tell you if you have enough antibodies in your body to protect yourself or your baby against whooping cough. Even if you have been sick with whooping cough in the past or previously received the vaccine, you still should get the vaccine during each pregnancy. Breastfeeding may pass some protective antibodies onto your baby By breastfeeding, you may pass some antibodies you have made in response to the vaccine to your baby. When you get a whooping cough vaccine during your pregnancy, you will have antibodies in your breast milk that you can share with your baby as soon as your milk comes in. However, your baby will not get protective antibodies immediately if you wait to get the whooping cough vaccine until after delivering your baby. This is because it takes about 2 weeks for your body to create antibodies. Learn more about the health benefits of  breastfeeding.

## 2024-06-20 ENCOUNTER — Ambulatory Visit

## 2024-07-15 ENCOUNTER — Ambulatory Visit: Attending: Obstetrics and Gynecology

## 2024-07-15 ENCOUNTER — Other Ambulatory Visit

## 2024-07-15 ENCOUNTER — Ambulatory Visit (INDEPENDENT_AMBULATORY_CARE_PROVIDER_SITE_OTHER): Admitting: Family Medicine

## 2024-07-15 ENCOUNTER — Ambulatory Visit: Admitting: Maternal & Fetal Medicine

## 2024-07-15 VITALS — BP 122/68

## 2024-07-15 VITALS — BP 118/78 | HR 77 | Wt 168.1 lb

## 2024-07-15 DIAGNOSIS — Z148 Genetic carrier of other disease: Secondary | ICD-10-CM | POA: Insufficient documentation

## 2024-07-15 DIAGNOSIS — R21 Rash and other nonspecific skin eruption: Secondary | ICD-10-CM

## 2024-07-15 DIAGNOSIS — O09213 Supervision of pregnancy with history of pre-term labor, third trimester: Secondary | ICD-10-CM | POA: Diagnosis not present

## 2024-07-15 DIAGNOSIS — O09899 Supervision of other high risk pregnancies, unspecified trimester: Secondary | ICD-10-CM | POA: Insufficient documentation

## 2024-07-15 DIAGNOSIS — D560 Alpha thalassemia: Secondary | ICD-10-CM | POA: Diagnosis not present

## 2024-07-15 DIAGNOSIS — D563 Thalassemia minor: Secondary | ICD-10-CM

## 2024-07-15 DIAGNOSIS — Z23 Encounter for immunization: Secondary | ICD-10-CM

## 2024-07-15 DIAGNOSIS — O09293 Supervision of pregnancy with other poor reproductive or obstetric history, third trimester: Secondary | ICD-10-CM | POA: Diagnosis not present

## 2024-07-15 DIAGNOSIS — Z362 Encounter for other antenatal screening follow-up: Secondary | ICD-10-CM | POA: Diagnosis not present

## 2024-07-15 DIAGNOSIS — Z3480 Encounter for supervision of other normal pregnancy, unspecified trimester: Secondary | ICD-10-CM

## 2024-07-15 DIAGNOSIS — Z8759 Personal history of other complications of pregnancy, childbirth and the puerperium: Secondary | ICD-10-CM | POA: Diagnosis not present

## 2024-07-15 DIAGNOSIS — Z3A28 28 weeks gestation of pregnancy: Secondary | ICD-10-CM

## 2024-07-15 DIAGNOSIS — O09893 Supervision of other high risk pregnancies, third trimester: Secondary | ICD-10-CM | POA: Diagnosis not present

## 2024-07-15 DIAGNOSIS — O99013 Anemia complicating pregnancy, third trimester: Secondary | ICD-10-CM

## 2024-07-15 MED ORDER — TRIAMCINOLONE ACETONIDE 0.5 % EX OINT: 1.0000 | TOPICAL_OINTMENT | Freq: Two times a day (BID) | CUTANEOUS | 0 refills | Status: AC

## 2024-07-15 MED ORDER — ASPIRIN 81 MG PO TBEC: 81.0000 mg | DELAYED_RELEASE_TABLET | Freq: Every day | ORAL | 1 refills | Status: AC

## 2024-07-15 NOTE — Progress Notes (Signed)
 "  PRENATAL VISIT NOTE  Subjective:  Sandra Palmer is a 28 y.o. [phone] at [redacted]w[redacted]d being seen today for ongoing prenatal care.  She is currently monitored for the following issues for this high-risk pregnancy and has Alpha thalassemia silent carrier; History of gestational hypertension; Supervision of other normal pregnancy, antepartum; and History of preterm delivery, currently pregnant on their problem list.  Patient reports rash on leg, was red, and now has faded.  Contractions: Not present. Vag. Bleeding: None.  Movement: Present. Denies leaking of fluid.   The following portions of the patient's history were reviewed and updated as appropriate: allergies, current medications, past family history, past medical history, past social history, past surgical history and problem list.   Objective:   Vitals:   07/15/24 0858  BP: 118/78  Pulse: 77  Weight: 168 lb 2 oz (76.3 kg)    Fetal Status:  Fetal Heart Rate (bpm): 160   Movement: Present    General: Alert, oriented and cooperative. Patient is in no acute distress.  Skin: Skin is warm and dry. hyperpigmentation noted on inner thigh  Cardiovascular: Normal heart rate noted  Respiratory: Normal respiratory effort, no problems with respiration noted  Abdomen: Soft, gravid, appropriate for gestational age.  Pain/Pressure: Absent     Pelvic: Cervical exam deferred        Extremities: Normal range of motion.  Edema: None  Mental Status: Normal mood and affect. Normal behavior. Normal judgment and thought content.      03/18/2024    3:52 PM  Depression screen PHQ 2/9  Decreased Interest 1  Down, Depressed, Hopeless 0  PHQ - 2 Score 1  Altered sleeping 0  Tired, decreased energy 1  Change in appetite 2  Feeling bad or failure about yourself  2  Trouble concentrating 0  Moving slowly or fidgety/restless 0  Suicidal thoughts 0  PHQ-9 Score 6      Data saved with a previous flowsheet row definition        03/18/2024    3:52 PM  GAD  7 : Generalized Anxiety Score  Nervous, Anxious, on Edge 0  Control/stop worrying 0  Worry too much - different things 0  Trouble relaxing 0  Restless 0  Easily annoyed or irritable 0  Afraid - awful might happen 0  Total GAD 7 Score 0    Assessment and Plan:  Pregnancy: G5P0131 at [redacted]w[redacted]d 1. Supervision of other normal pregnancy, antepartum (Primary) 28 week labs today Continue prenatal care.  2. History of preterm delivery, currently pregnant Had some pain, which is now resolved. Reviewed PTL s/sx's and when to go to [facility]  3. Alpha thalassemia silent carrier   4. History of gestational hypertension On ASA BP is well controlled today - aspirin  EC 81 MG tablet; Take 1 tablet (81 mg total) by mouth daily.  Dispense: 90 tablet; Refill: 1  5. [redacted] weeks gestation of pregnancy   6. Rash Trial of small amount of kenalog and see if this helps the hyperpigmentation - triamcinolone ointment (KENALOG) 0.5 %; Apply 1 Application topically 2 (two) times daily.  Dispense: 30 g; Refill: 0  Preterm labor symptoms and general obstetric precautions including but not limited to vaginal bleeding, contractions, leaking of fluid and fetal movement were reviewed in detail with the patient. Please refer to After Visit Summary for other counseling recommendations.   Return in 2 weeks (on 07/29/2024).  Future Appointments  Date Time Provider Department Center  07/15/2024  3:15 PM [facility]  PROVIDER 1 [facility]  07/15/2024  3:30 PM [facility] US1 [facility]  08/12/2024 10:35 AM Fredirick Glenys RAMAN, MD [facility]    Glenys RAMAN Fredirick, MD  "

## 2024-07-16 ENCOUNTER — Other Ambulatory Visit: Payer: Self-pay | Admitting: *Deleted

## 2024-07-16 DIAGNOSIS — O358XX0 Maternal care for other (suspected) fetal abnormality and damage, not applicable or unspecified: Secondary | ICD-10-CM

## 2024-07-16 DIAGNOSIS — O09893 Supervision of other high risk pregnancies, third trimester: Secondary | ICD-10-CM

## 2024-07-16 DIAGNOSIS — O09293 Supervision of pregnancy with other poor reproductive or obstetric history, third trimester: Secondary | ICD-10-CM

## 2024-07-16 LAB — CBC
Hematocrit: 38.4 % (ref 34.0–46.6)
Hemoglobin: 12.6 g/dL (ref 11.1–15.9)
MCH: 29 pg (ref 26.6–33.0)
MCHC: 32.8 g/dL (ref 31.5–35.7)
MCV: 88 fL (ref 79–97)
Platelets: 243 x10E3/uL (ref 150–450)
RBC: 4.35 x10E6/uL (ref 3.77–5.28)
RDW: 12.8 % (ref 11.7–15.4)
WBC: 9.4 x10E3/uL (ref 3.4–10.8)

## 2024-07-16 LAB — SYPHILIS: RPR W/REFLEX TO RPR TITER AND TREPONEMAL ANTIBODIES, TRADITIONAL SCREENING AND DIAGNOSIS ALGORITHM: RPR Ser Ql: NONREACTIVE

## 2024-07-16 LAB — GLUCOSE TOLERANCE, 2 HOURS W/ 1HR
Glucose, 1 hour: 116 mg/dL (ref 70–179)
Glucose, 2 hour: 88 mg/dL (ref 70–152)
Glucose, Fasting: 70 mg/dL (ref 70–91)

## 2024-07-16 LAB — HIV ANTIBODY (ROUTINE TESTING W REFLEX): HIV Screen 4th Generation wRfx: NONREACTIVE

## 2024-07-18 ENCOUNTER — Encounter: Payer: Self-pay | Admitting: Obstetrics and Gynecology

## 2024-08-06 ENCOUNTER — Ambulatory Visit: Attending: Obstetrics and Gynecology

## 2024-08-06 ENCOUNTER — Ambulatory Visit (HOSPITAL_BASED_OUTPATIENT_CLINIC_OR_DEPARTMENT_OTHER): Admitting: Obstetrics and Gynecology

## 2024-08-06 VITALS — BP 126/75 | HR 69

## 2024-08-06 DIAGNOSIS — Z8759 Personal history of other complications of pregnancy, childbirth and the puerperium: Secondary | ICD-10-CM | POA: Insufficient documentation

## 2024-08-06 DIAGNOSIS — Z3A31 31 weeks gestation of pregnancy: Secondary | ICD-10-CM

## 2024-08-06 DIAGNOSIS — O09893 Supervision of other high risk pregnancies, third trimester: Secondary | ICD-10-CM | POA: Insufficient documentation

## 2024-08-06 DIAGNOSIS — O99013 Anemia complicating pregnancy, third trimester: Secondary | ICD-10-CM

## 2024-08-06 DIAGNOSIS — O358XX0 Maternal care for other (suspected) fetal abnormality and damage, not applicable or unspecified: Secondary | ICD-10-CM | POA: Insufficient documentation

## 2024-08-06 DIAGNOSIS — O09213 Supervision of pregnancy with history of pre-term labor, third trimester: Secondary | ICD-10-CM

## 2024-08-06 DIAGNOSIS — O2613 Low weight gain in pregnancy, third trimester: Secondary | ICD-10-CM

## 2024-08-06 DIAGNOSIS — D563 Thalassemia minor: Secondary | ICD-10-CM | POA: Diagnosis not present

## 2024-08-06 DIAGNOSIS — O09293 Supervision of pregnancy with other poor reproductive or obstetric history, third trimester: Secondary | ICD-10-CM | POA: Insufficient documentation

## 2024-08-06 DIAGNOSIS — O09899 Supervision of other high risk pregnancies, unspecified trimester: Secondary | ICD-10-CM

## 2024-08-06 NOTE — Progress Notes (Signed)
 After review, MFM consult with provider is not indicated for today  Arna Ranks, MD 08/06/2024 12:30 PM  [facility]

## 2024-08-12 ENCOUNTER — Ambulatory Visit: Admitting: Family Medicine

## 2024-08-12 VITALS — BP 124/79 | HR 79 | Wt 171.6 lb

## 2024-08-12 DIAGNOSIS — O09899 Supervision of other high risk pregnancies, unspecified trimester: Secondary | ICD-10-CM

## 2024-08-12 DIAGNOSIS — Z3A32 32 weeks gestation of pregnancy: Secondary | ICD-10-CM | POA: Diagnosis not present

## 2024-08-12 DIAGNOSIS — O09893 Supervision of other high risk pregnancies, third trimester: Secondary | ICD-10-CM

## 2024-08-12 DIAGNOSIS — Z8759 Personal history of other complications of pregnancy, childbirth and the puerperium: Secondary | ICD-10-CM | POA: Diagnosis not present

## 2024-08-12 DIAGNOSIS — Z3480 Encounter for supervision of other normal pregnancy, unspecified trimester: Secondary | ICD-10-CM

## 2024-08-12 NOTE — Progress Notes (Signed)
" ° °  PRENATAL VISIT NOTE  Subjective:  Sandra Palmer is a 29 y.o. [phone] at [redacted]w[redacted]d being seen today for ongoing prenatal care.  She is currently monitored for the following issues for this high-risk pregnancy and has Alpha thalassemia silent carrier; History of gestational hypertension; Supervision of other normal pregnancy, antepartum; and History of preterm delivery, currently pregnant on their problem list.  Patient reports no complaints.  Contractions: Not present. Vag. Bleeding: None.  Movement: Present. Denies leaking of fluid.   The following portions of the patient's history were reviewed and updated as appropriate: allergies, current medications, past family history, past medical history, past social history, past surgical history and problem list.   Objective:   Vitals:   08/12/24 1100  BP: 124/79  Pulse: 79  Weight: 171 lb 9.6 oz (77.8 kg)    Fetal Status:  Fetal Heart Rate (bpm): 138 Fundal Height: 29 cm Movement: Present    General: Alert, oriented and cooperative. Patient is in no acute distress.  Skin: Skin is warm and dry. No rash noted.   Cardiovascular: Normal heart rate noted  Respiratory: Normal respiratory effort, no problems with respiration noted  Abdomen: Soft, gravid, appropriate for gestational age.  Pain/Pressure: Present     Pelvic: Cervical exam deferred        Extremities: Normal range of motion.  Edema: None  Mental Status: Normal mood and affect. Normal behavior. Normal judgment and thought content.      03/18/2024    3:52 PM  Depression screen PHQ 2/9  Decreased Interest 1  Down, Depressed, Hopeless 0  PHQ - 2 Score 1  Altered sleeping 0  Tired, decreased energy 1  Change in appetite 2  Feeling bad or failure about yourself  2  Trouble concentrating 0  Moving slowly or fidgety/restless 0  Suicidal thoughts 0  PHQ-9 Score 6      Data saved with a previous flowsheet row definition        03/18/2024    3:52 PM  GAD 7 : Generalized Anxiety  Score  Nervous, Anxious, on Edge 0   Control/stop worrying 0   Worry too much - different things 0   Trouble relaxing 0   Restless 0   Easily annoyed or irritable 0   Afraid - awful might happen 0   Total GAD 7 Score 0     Data saved with a previous flowsheet row definition    Assessment and Plan:  Pregnancy: G5P0131 at [redacted]w[redacted]d 1. Supervision of other normal pregnancy, antepartum (Primary) Continue prenatal care.  2. History of preterm delivery, currently pregnant No s/sx's of PTL  3. History of gestational hypertension Normal BP  4. [redacted] weeks gestation of pregnancy   Preterm labor symptoms and general obstetric precautions including but not limited to vaginal bleeding, contractions, leaking of fluid and fetal movement were reviewed in detail with the patient. Please refer to After Visit Summary for other counseling recommendations.   Return in 2 weeks (on 08/26/2024).  Future Appointments  Date Time Provider Department Center  08/26/2024  2:30 PM Herchel Gloris LABOR, MD [facility]  09/11/2024  3:30 PM Izell Harari, MD [facility]  09/23/2024  2:30 PM Izell Harari, MD [facility]    [name], MD  "

## 2024-08-22 NOTE — Progress Notes (Signed)
 After review, MFM consult with provider is not indicated for today  Sandra Nathanel Pipe, MD 08/22/2024 10:48 AM  [facility]

## 2024-08-26 ENCOUNTER — Encounter: Admitting: Obstetrics & Gynecology

## 2024-09-11 ENCOUNTER — Encounter: Admitting: Obstetrics and Gynecology

## 2024-09-23 ENCOUNTER — Encounter: Admitting: Obstetrics and Gynecology
